# Patient Record
Sex: Male | Born: 1960 | Race: White | Hispanic: No | Marital: Single | Smoking: Current every day smoker
Health system: Southern US, Community
[De-identification: ages and names within clinical notes are randomized; demographics above are authoritative.]

## PROBLEM LIST (undated history)

## (undated) DIAGNOSIS — E785 Hyperlipidemia, unspecified: Secondary | ICD-10-CM

## (undated) DIAGNOSIS — E119 Type 2 diabetes mellitus without complications: Secondary | ICD-10-CM

## (undated) DIAGNOSIS — K759 Inflammatory liver disease, unspecified: Secondary | ICD-10-CM

## (undated) DIAGNOSIS — J449 Chronic obstructive pulmonary disease, unspecified: Secondary | ICD-10-CM

## (undated) DIAGNOSIS — F209 Schizophrenia, unspecified: Secondary | ICD-10-CM

## (undated) DIAGNOSIS — K219 Gastro-esophageal reflux disease without esophagitis: Secondary | ICD-10-CM

## (undated) HISTORY — PX: COLONOSCOPY: SHX174

## (undated) HISTORY — PX: WRIST SURGERY: SHX841

## (undated) HISTORY — PX: TOE SURGERY: SHX1073

## (undated) HISTORY — PX: FOREARM SURGERY: SHX651

---

## 2004-08-19 ENCOUNTER — Emergency Department: Payer: Self-pay | Admitting: Unknown Physician Specialty

## 2008-07-18 ENCOUNTER — Ambulatory Visit: Payer: Self-pay | Admitting: Family Medicine

## 2009-03-27 ENCOUNTER — Ambulatory Visit: Payer: Self-pay | Admitting: Gastroenterology

## 2011-01-27 ENCOUNTER — Ambulatory Visit: Payer: Self-pay

## 2012-10-15 IMAGING — CT CT HEAD WITHOUT CONTRAST
1 series · 16 of 30 positions shown, 20 images · non-contrast
Comparison: none

REASON FOR EXAM: headaches and dizziness
COMMENTS:

PROCEDURE:     CT  - CT HEAD WITHOUT CONTRAST  - January 27, 2011 [DATE]
RESULT:     Comparison:  None
TECHNIQUE: Multiple axial images from the foramen magnum to the vertex were
obtained without IV contrast.

[Series 2: soft tissue · axial · 0.42mm/px · z∈[+382,+522]mm · 16 of 32 slices shown, 20 images]
[im 2/32  brain]
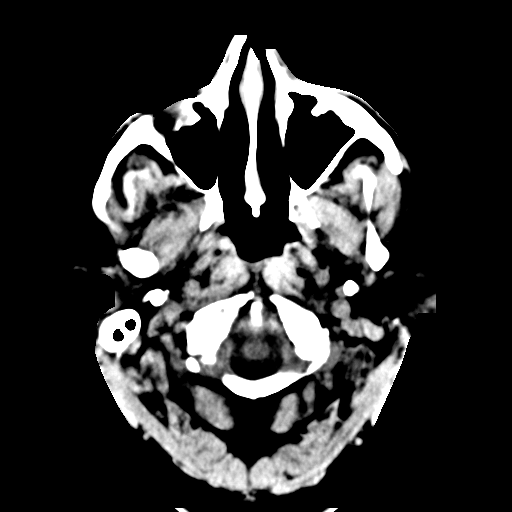
[im 2/32  bone]
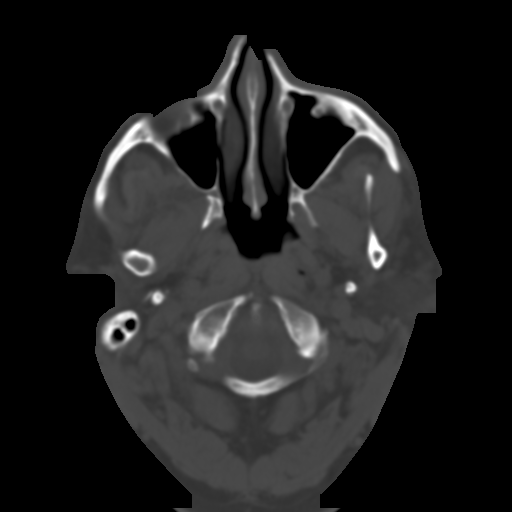
[im 4/32  brain]
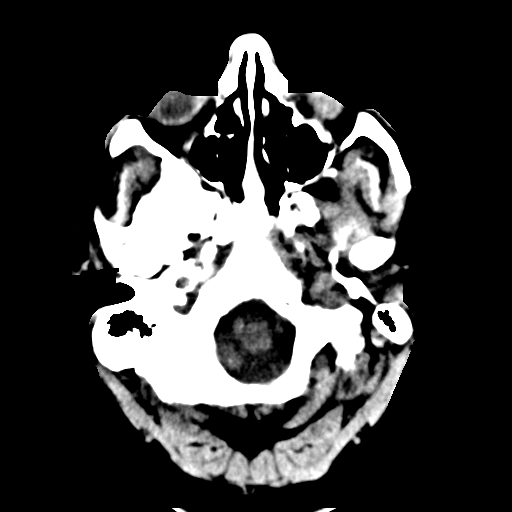
[im 6/32  brain]
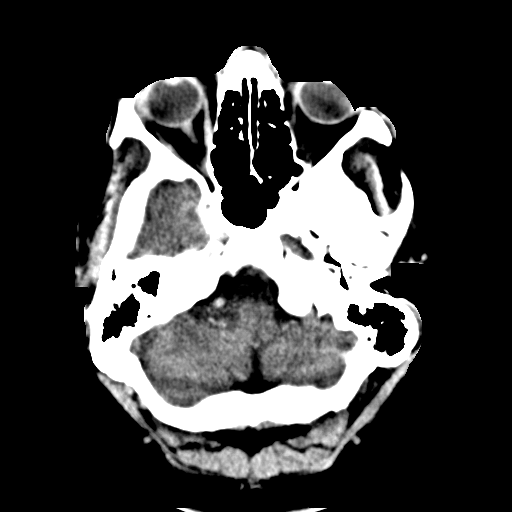
[im 8/32  brain]
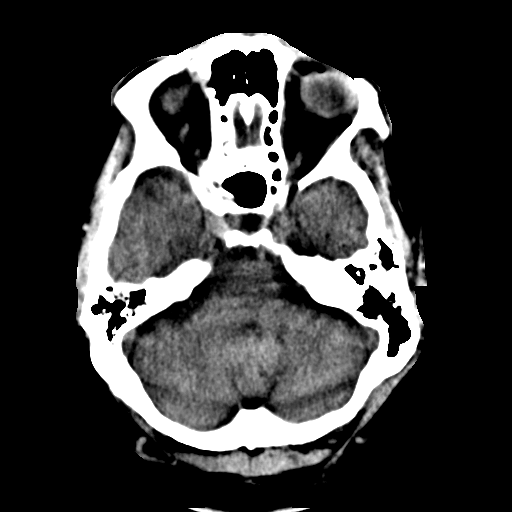
[im 9/32  brain]
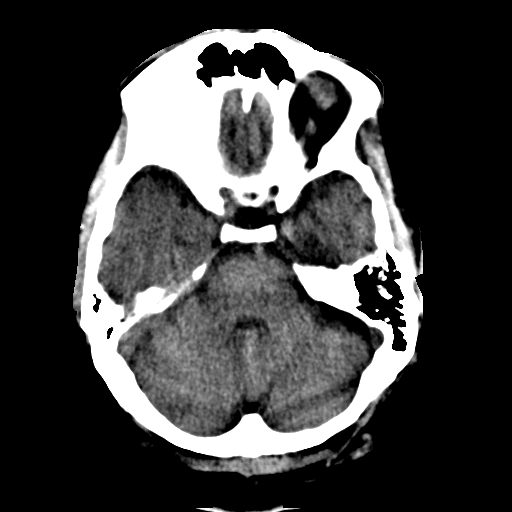
[im 9/32  bone]
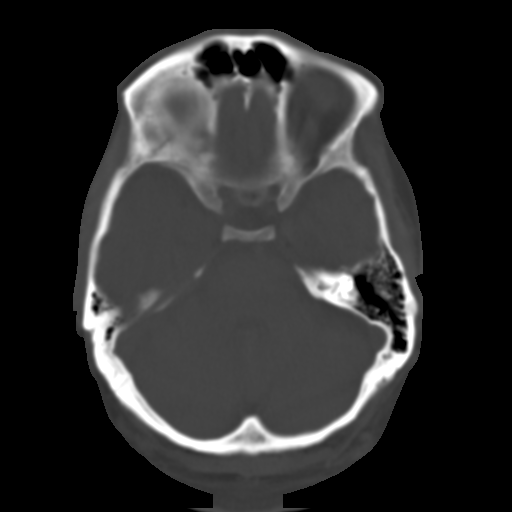
[im 11/32  brain]
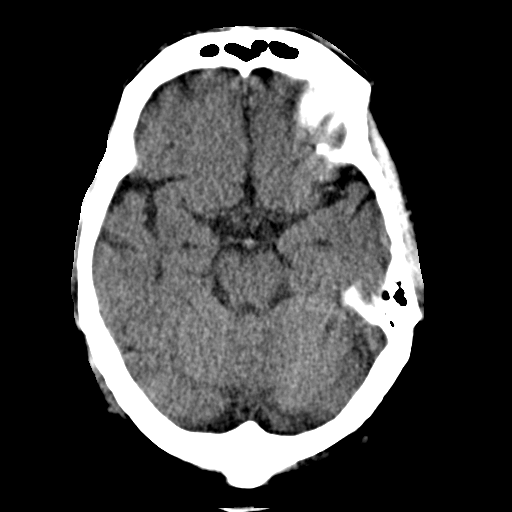
[im 13/32  brain]
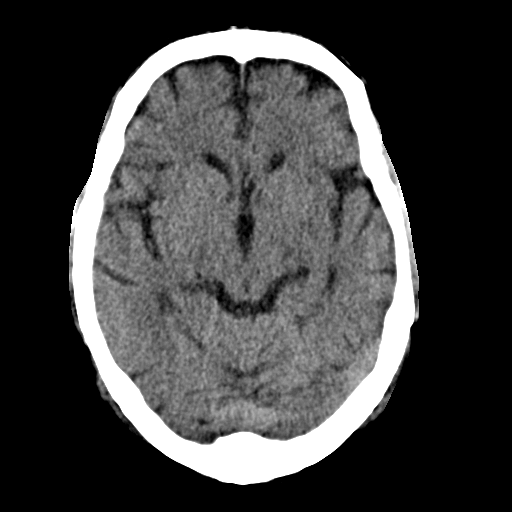
[im 15/32  brain]
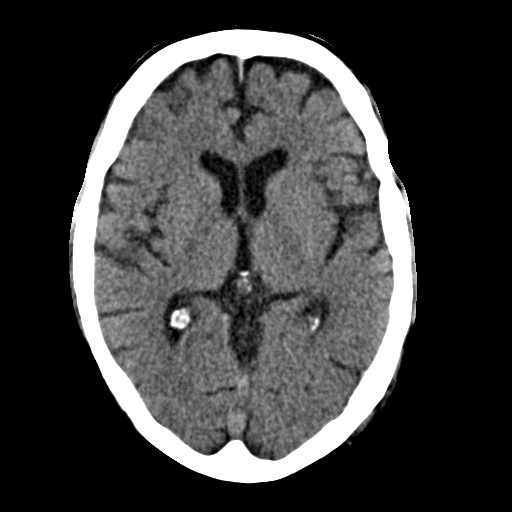
[im 17/32  brain]
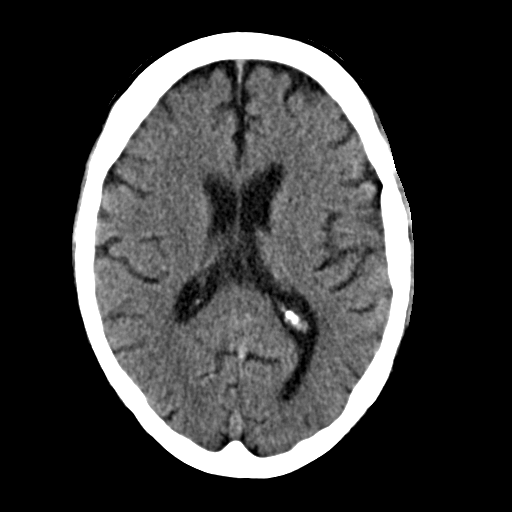
[im 17/32  bone]
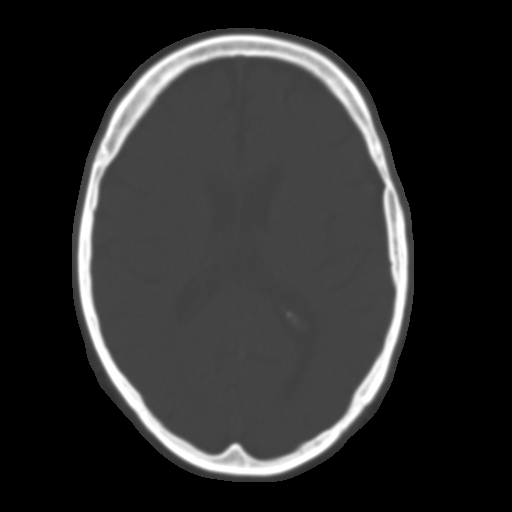
[im 19/32  brain]
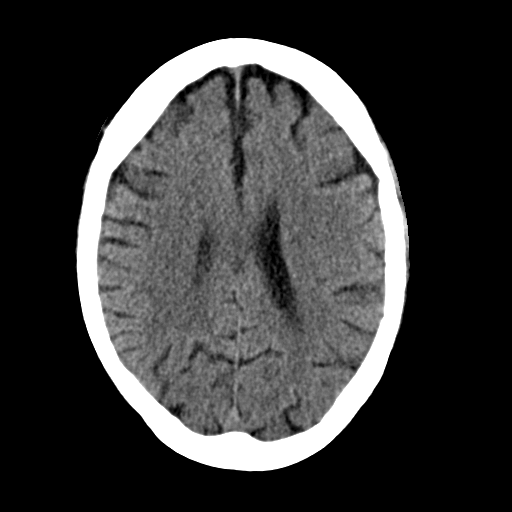
[im 21/32  brain]
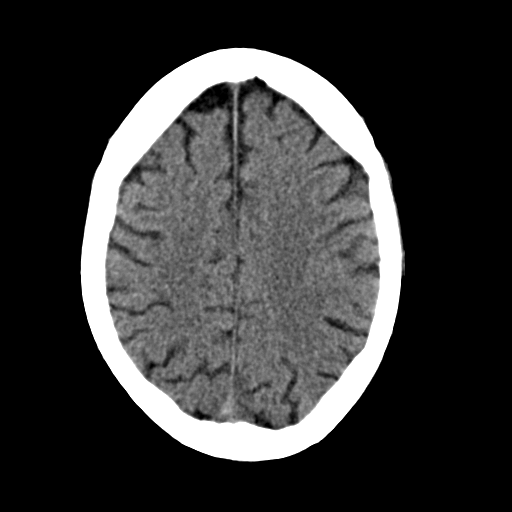
[im 23/32  brain]
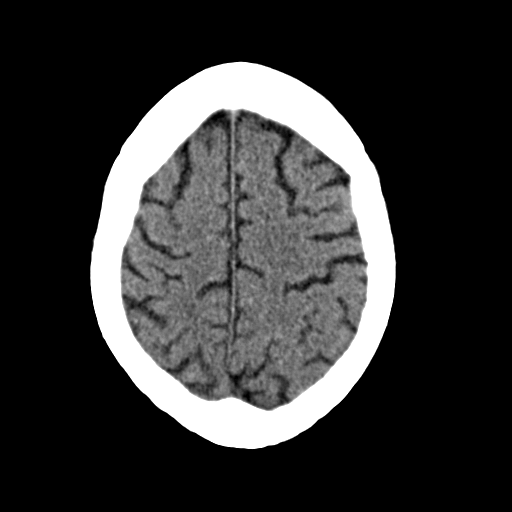
[im 24/32  brain]
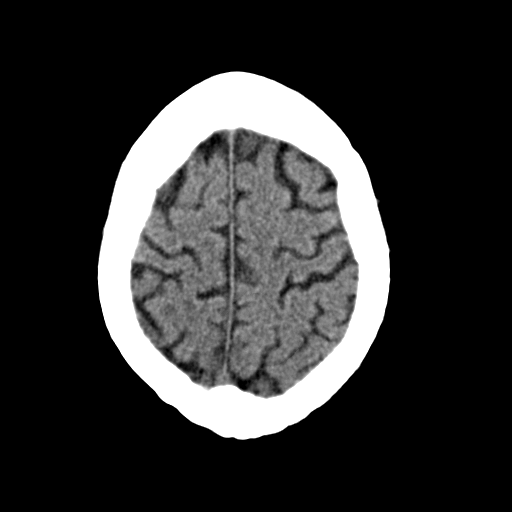
[im 24/32  bone]
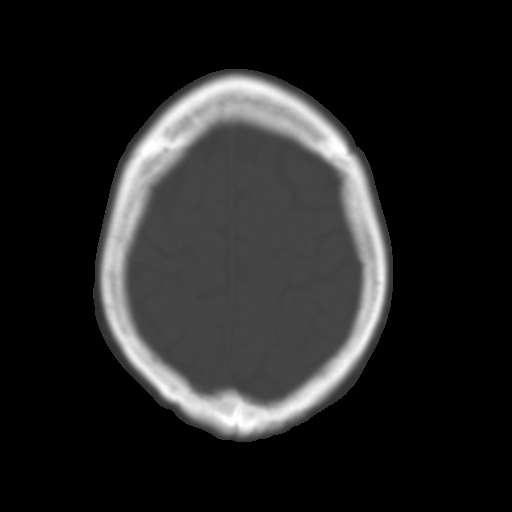
[im 26/32  brain]
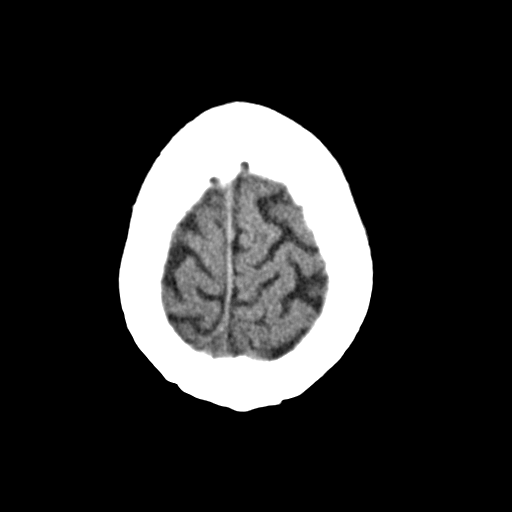
[im 28/32  brain]
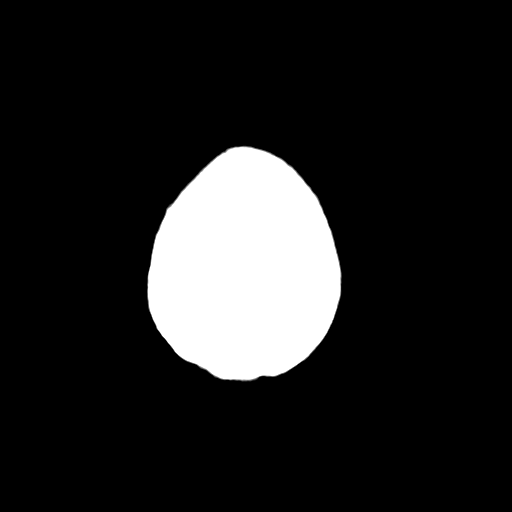
[im 30/32  brain]
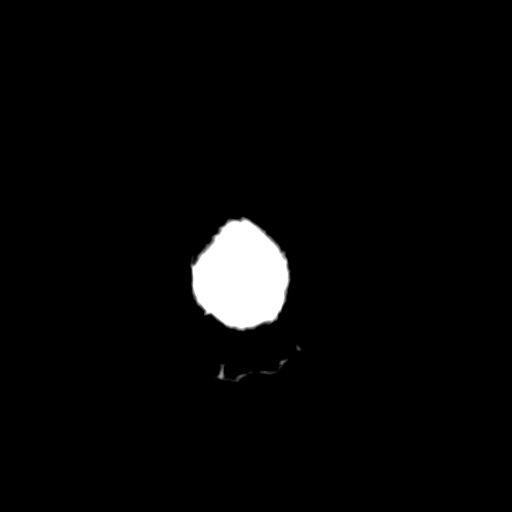

[16 of 30 positions shown; findings below may reference images not displayed]

FINDINGS: There is no evidence for mass effect, midline shift, or extra-axial fluid
collections. There is no evidence for space-occupying lesion, intracranial
hemorrhage, or cortical-based area of infarction.

The osseous structures are unremarkable.
IMPRESSION: No acute intracranial process.

If there is continued clinical concern, consider MRI for further evaluation.

## 2015-07-31 ENCOUNTER — Other Ambulatory Visit: Payer: Self-pay | Admitting: Gastroenterology

## 2015-07-31 DIAGNOSIS — K219 Gastro-esophageal reflux disease without esophagitis: Secondary | ICD-10-CM | POA: Insufficient documentation

## 2015-07-31 DIAGNOSIS — Z8659 Personal history of other mental and behavioral disorders: Secondary | ICD-10-CM | POA: Insufficient documentation

## 2015-07-31 DIAGNOSIS — B182 Chronic viral hepatitis C: Secondary | ICD-10-CM

## 2015-07-31 DIAGNOSIS — Z8679 Personal history of other diseases of the circulatory system: Secondary | ICD-10-CM | POA: Insufficient documentation

## 2015-07-31 DIAGNOSIS — E049 Nontoxic goiter, unspecified: Secondary | ICD-10-CM | POA: Insufficient documentation

## 2015-07-31 DIAGNOSIS — J449 Chronic obstructive pulmonary disease, unspecified: Secondary | ICD-10-CM | POA: Insufficient documentation

## 2015-08-05 ENCOUNTER — Ambulatory Visit
Admission: RE | Admit: 2015-08-05 | Discharge: 2015-08-05 | Disposition: A | Discharge status: Home / Self Care | Payer: Medicare Other | Source: Ambulatory Visit | Attending: Gastroenterology | Admitting: Gastroenterology

## 2015-08-05 DIAGNOSIS — B182 Chronic viral hepatitis C: Secondary | ICD-10-CM

## 2015-08-12 ENCOUNTER — Ambulatory Visit
Admission: RE | Admit: 2015-08-12 | Discharge: 2015-08-12 | Disposition: A | Discharge status: Home / Self Care | Payer: Medicare Other | Source: Ambulatory Visit | Attending: Gastroenterology | Admitting: Gastroenterology

## 2015-08-12 DIAGNOSIS — B182 Chronic viral hepatitis C: Secondary | ICD-10-CM | POA: Insufficient documentation

## 2015-09-24 ENCOUNTER — Encounter: Payer: Self-pay | Admitting: *Deleted

## 2015-09-25 ENCOUNTER — Ambulatory Visit: Payer: Medicare Other | Admitting: Certified Registered Nurse Anesthetist

## 2015-09-25 ENCOUNTER — Encounter: Payer: Self-pay | Admitting: *Deleted

## 2015-09-25 ENCOUNTER — Encounter
Admission: RE | Disposition: A | Discharge status: Home / Self Care | Payer: Self-pay | Source: Ambulatory Visit | Attending: Gastroenterology

## 2015-09-25 ENCOUNTER — Ambulatory Visit
Admission: RE | Admit: 2015-09-25 | Discharge: 2015-09-25 | Disposition: A | Discharge status: Home / Self Care | Payer: Medicare Other | Source: Ambulatory Visit | Attending: Gastroenterology | Admitting: Gastroenterology

## 2015-09-25 DIAGNOSIS — Z79899 Other long term (current) drug therapy: Secondary | ICD-10-CM | POA: Diagnosis not present

## 2015-09-25 DIAGNOSIS — Z1211 Encounter for screening for malignant neoplasm of colon: Secondary | ICD-10-CM | POA: Diagnosis present

## 2015-09-25 DIAGNOSIS — F209 Schizophrenia, unspecified: Secondary | ICD-10-CM | POA: Diagnosis not present

## 2015-09-25 DIAGNOSIS — J449 Chronic obstructive pulmonary disease, unspecified: Secondary | ICD-10-CM | POA: Diagnosis not present

## 2015-09-25 DIAGNOSIS — Z538 Procedure and treatment not carried out for other reasons: Secondary | ICD-10-CM | POA: Diagnosis not present

## 2015-09-25 DIAGNOSIS — E785 Hyperlipidemia, unspecified: Secondary | ICD-10-CM | POA: Diagnosis not present

## 2015-09-25 DIAGNOSIS — K219 Gastro-esophageal reflux disease without esophagitis: Secondary | ICD-10-CM | POA: Insufficient documentation

## 2015-09-25 HISTORY — DX: Inflammatory liver disease, unspecified: K75.9

## 2015-09-25 HISTORY — PX: COLONOSCOPY WITH PROPOFOL: SHX5780

## 2015-09-25 HISTORY — DX: Gastro-esophageal reflux disease without esophagitis: K21.9

## 2015-09-25 HISTORY — DX: Schizophrenia, unspecified: F20.9

## 2015-09-25 HISTORY — DX: Chronic obstructive pulmonary disease, unspecified: J44.9

## 2015-09-25 HISTORY — DX: Hyperlipidemia, unspecified: E78.5

## 2015-09-25 LAB — CBC
HCT: 40.3 % (ref 40.0–52.0)
Hemoglobin: 13.7 g/dL (ref 13.0–18.0)
MCH: 29.6 pg (ref 26.0–34.0)
MCHC: 33.9 g/dL (ref 32.0–36.0)
MCV: 87.1 fL (ref 80.0–100.0)
Platelets: 299 10*3/uL (ref 150–440)
RBC: 4.62 MIL/uL (ref 4.40–5.90)
RDW: 14.3 % (ref 11.5–14.5)
WBC: 4 10*3/uL (ref 3.8–10.6)

## 2015-09-25 LAB — PROTIME-INR
INR: 1.08
PROTHROMBIN TIME: 14.2 s (ref 11.4–15.0)

## 2015-09-25 SURGERY — COLONOSCOPY WITH PROPOFOL
Anesthesia: General

## 2015-09-25 MED ORDER — SODIUM CHLORIDE 0.9 % IV SOLN
INTRAVENOUS | Status: DC
  Administered 2015-09-25: 1000 mL via INTRAVENOUS

## 2015-09-25 MED ORDER — SODIUM CHLORIDE 0.9 % IV SOLN: INTRAVENOUS | Status: DC

## 2015-09-25 MED ORDER — PROPOFOL 500 MG/50ML IV EMUL
INTRAVENOUS | Status: DC | PRN
  Administered 2015-09-25: 140 ug/kg/min via INTRAVENOUS

## 2015-09-25 MED ORDER — PROPOFOL 10 MG/ML IV BOLUS
INTRAVENOUS | Status: DC | PRN
  Administered 2015-09-25: 30 mg via INTRAVENOUS

## 2015-09-25 NOTE — Anesthesia Preprocedure Evaluation (Signed)
Anesthesia Evaluation  Patient identified by MRN, date of birth, ID band Patient awake    Reviewed: Allergy & Precautions, H&P , NPO status , Patient's Chart, lab work & pertinent test results  History of Anesthesia Complications Negative for: history of anesthetic complications  Airway Mallampati: III  TM Distance: >3 FB Neck ROM: limited    Dental  (+) Poor Dentition, Missing   Pulmonary neg shortness of breath, COPD, Current Smoker,    Pulmonary exam normal breath sounds clear to auscultation       Cardiovascular Exercise Tolerance: Good (-) angina(-) Past MI and (-) DOE negative cardio ROS Normal cardiovascular exam Rhythm:regular Rate:Normal     Neuro/Psych PSYCHIATRIC DISORDERS negative neurological ROS     GI/Hepatic Neg liver ROS, GERD  Controlled,(+) Hepatitis -  Endo/Other  negative endocrine ROS  Renal/GU negative Renal ROS  negative genitourinary   Musculoskeletal   Abdominal   Peds  Hematology negative hematology ROS (+)   Anesthesia Other Findings Past Medical History:   COPD (chronic obstructive pulmonary disease) (*              GERD (gastroesophageal reflux disease)                       Schizophrenia (HCC)                                          Hyperlipidemia                                               Hepatitis                                                   Past Surgical History:   TOE SURGERY                                                   FOREARM SURGERY                                               COLONOSCOPY                                                  BMI    Body Mass Index   24.70 kg/m 2      Reproductive/Obstetrics negative OB ROS                             Anesthesia Physical Anesthesia Plan  ASA: III  Anesthesia Plan: General   Post-op Pain Management:    Induction:   Airway Management Planned:   Additional Equipment:    Intra-op Plan:  Post-operative Plan:   Informed Consent: I have reviewed the patients History and Physical, chart, labs and discussed the procedure including the risks, benefits and alternatives for the proposed anesthesia with the patient or authorized representative who has indicated his/her understanding and acceptance.   Dental Advisory Given  Plan Discussed with: Anesthesiologist, CRNA and Surgeon  Anesthesia Plan Comments:         Anesthesia Quick Evaluation

## 2015-09-25 NOTE — Op Note (Signed)
[facility] Gastroenterology Patient Name: Paul Summers Procedure Date: 09/25/2015 2:32 PM MRN: [id number] Account #: [id number] Date of Birth: [birth year] Admit Type: Outpatient Age: 55 Room: [facility] ENDO ROOM 4 Gender: Male Note Status: Finalized Procedure:            Colonoscopy Indications:          Screening for colorectal malignant neoplasm Providers:            [name], MD Referring MD:         Jordan Likes. Lavena Bullion (Referring MD) Medicines:            Monitored Anesthesia Care Complications:        No immediate complications. Procedure:            Pre-Anesthesia Assessment:                       - ASA Grade Assessment: III - A patient with severe                        systemic disease.                       After obtaining informed consent, the colonoscope was                        passed under direct vision. Throughout the procedure,                        the patient's blood pressure, pulse, and oxygen                        saturations were monitored continuously. The                        Colonoscope was introduced through the anus with the                        intention of advancing to the cecum. The scope was                        advanced to the sigmoid colon before the procedure was                        aborted. Medications were given. The colonoscopy was                        extremely difficult due to poor bowel prep with stool                        present. The patient tolerated the procedure well. Findings:      A moderate amount of semi-liquid stool was found in the sigmoid colon,       precluding visualization.      The digital rectal exam was normal. Impression:           - Stool in the sigmoid colon.                       - No specimens collected. Recommendation:       - Discharge patient to home.                       -  Patient to reprep and reschedule. Procedure Code(s):    --- Professional ---                       [id number],  82, Colonoscopy, flexible; diagnostic, including                        collection of specimen(s) by brushing or washing, when                        performed (separate procedure) Diagnosis Code(s):    --- Professional ---                       Z12.11, Encounter for screening for malignant neoplasm                        of colon CPT copyright 2016 [employer]. All rights reserved. The codes documented in this report are preliminary and upon coder review may  be revised to meet current compliance requirements. [name], MD 09/25/2015 3:25:35 PM This report has been signed electronically. Number of Addenda: 0 Note Initiated On: 09/25/2015 2:32 PM Total Procedure Duration: 0 hours 4 minutes 28 seconds       [facility]

## 2015-09-25 NOTE — H&P (Signed)
Outpatient short stay form Pre-procedure 09/25/2015 3:06 PM [name] MD  Primary Physician: Dr. Lavena Bullion  Reason for visit:  Colonoscopy  History of present illness:  Patient is a 55 year old male presenting today for colonoscopy. His last colonoscopy was 2010 with the finding of a adenoma. He tolerated his prep well. He denies use of any aspirin products or blood thinners.    Current facility-administered medications:  .  0.9 %  sodium chloride infusion, , Intravenous, Continuous, [name], MD, Last Rate: 20 mL/hr at 09/25/15 1424, 1,000 mL at 09/25/15 1424 .  0.9 %  sodium chloride infusion, , Intravenous, Continuous, [name], MD  Prescriptions prior to admission  Medication Sig Dispense Refill Last Dose  . benztropine (COGENTIN) 1 MG tablet Take 1 mg by mouth 2 (two) times daily.     . cholecalciferol (VITAMIN D) 1000 units tablet Take 1,000 Units by mouth daily.   09/25/2015 at 0700  . divalproex (DEPAKOTE ER) 500 MG 24 hr tablet Take 1,000 mg by mouth daily.     . fenofibrate 160 MG tablet Take 160 mg by mouth daily.     . Fish Oil-Cholecalciferol (OMEGA-3 FISH OIL/VITAMIN D3) 1000-1000 MG-UNIT CAPS Take by mouth.     . Fluticasone-Salmeterol (ADVAIR) 250-50 MCG/DOSE AEPB Inhale 1 puff into the lungs 2 (two) times daily.     . haloperidol lactate (HALDOL) 5 MG/ML injection 5 mg.     Marland Kitchen LORazepam (ATIVAN) 1 MG tablet Take 1 mg by mouth every 8 (eight) hours.     Marland Kitchen OLANZapine (ZYPREXA) 20 MG tablet Take 20 mg by mouth at bedtime.     Marland Kitchen omeprazole (PRILOSEC) 20 MG capsule Take 20 mg by mouth daily.   09/25/2015 at 0700  . Skin Protectants, Misc. (EUCERIN) cream Apply topically as needed for dry skin.     . [DISCONTINUED] albuterol (PROVENTIL HFA;VENTOLIN HFA) 108 (90 Base) MCG/ACT inhaler Inhale 2 puffs into the lungs every 6 (six) hours as needed for wheezing or shortness of breath.     . [DISCONTINUED] QUEtiapine (SEROQUEL) 100 MG tablet Take 100 mg by mouth at  bedtime.     . [DISCONTINUED] tiotropium (SPIRIVA) 18 MCG inhalation capsule Place 18 mcg into inhaler and inhale daily.     . [DISCONTINUED] varenicline (CHANTIX) 1 MG tablet Take 1 mg by mouth 2 (two) times daily.        Allergies  Allergen Reactions  . Ether   . Sulfa Antibiotics      Past Medical History  Diagnosis Date  . COPD (chronic obstructive pulmonary disease) ([place])   . GERD (gastroesophageal reflux disease)   . Schizophrenia ([place])   . Hyperlipidemia   . Hepatitis     Review of systems:      Physical Exam    Heart and lungs: Regular rate and rhythm without rub or gallop, lungs are bilaterally clear.    HEENT: Normocephalic atraumatic eyes are anicteric    Other:     Pertinant exam for procedure: Soft nontender nondistended bowel sounds positive normoactive.    Planned proceedures: Colonoscopy and indicated procedures. I have discussed the risks benefits and complications of procedures to include not limited to bleeding, infection, perforation and the risk of sedation and the patient wishes to proceed.    [name], MD Gastroenterology 09/25/2015  3:06 PM

## 2015-09-25 NOTE — Transfer of Care (Signed)
Immediate Anesthesia Transfer of Care Note  Patient: Paul Summers  Procedure(s) Performed: Procedure(s): COLONOSCOPY WITH PROPOFOL (N/A)  Patient Location: PACU  Anesthesia Type:General  Level of Consciousness: sedated  Airway & Oxygen Therapy: Patient Spontanous Breathing and Patient connected to nasal cannula oxygen  Post-op Assessment: Report given to RN  Post vital signs: Reviewed and stable  Last Vitals:  Filed Vitals:   09/25/15 1306 09/25/15 1524  BP: 104/66 100/68  Pulse: 74 71  Temp: 36.4 C 36 C  Resp: 16 18    Last Pain: There were no vitals filed for this visit.       Complications: No apparent anesthesia complications

## 2015-09-25 NOTE — Anesthesia Procedure Notes (Signed)
Date/Time: 09/25/2015 3:10 PM Performed by: Johnna Acosta Pre-anesthesia Checklist: Patient identified, Emergency Drugs available, Suction available, Patient being monitored and Timeout performed Patient Re-evaluated:Patient Re-evaluated prior to inductionOxygen Delivery Method: Nasal cannula

## 2015-09-27 ENCOUNTER — Encounter: Payer: Self-pay | Admitting: Gastroenterology

## 2015-09-28 NOTE — Anesthesia Postprocedure Evaluation (Signed)
Anesthesia Post Note  Patient: Paul Summers  Procedure(s) Performed: Procedure(s) (LRB): COLONOSCOPY WITH PROPOFOL (N/A)  Patient location during evaluation: Endoscopy Anesthesia Type: General Level of consciousness: awake and alert Pain management: pain level controlled Vital Signs Assessment: post-procedure vital signs reviewed and stable Respiratory status: spontaneous breathing, nonlabored ventilation, respiratory function stable and patient connected to nasal cannula oxygen Cardiovascular status: blood pressure returned to baseline and stable Postop Assessment: no signs of nausea or vomiting Anesthetic complications: no    Last Vitals:  Filed Vitals:   09/25/15 1550 09/25/15 1600  BP: 115/81 123/82  Pulse: 57 68  Temp:    Resp: 15 12    Last Pain: There were no vitals filed for this visit.               Martha Clan

## 2015-09-29 ENCOUNTER — Ambulatory Visit: Payer: Medicare Other | Admitting: Anesthesiology

## 2015-09-29 ENCOUNTER — Encounter
Admission: RE | Disposition: A | Discharge status: Nursing Home (Includes ICF) | Payer: Self-pay | Source: Ambulatory Visit | Attending: Gastroenterology

## 2015-09-29 ENCOUNTER — Encounter: Payer: Self-pay | Admitting: *Deleted

## 2015-09-29 ENCOUNTER — Ambulatory Visit
Admission: RE | Admit: 2015-09-29 | Discharge: 2015-09-29 | Disposition: A | Discharge status: Nursing Home (Includes ICF) | Payer: Medicare Other | Source: Ambulatory Visit | Attending: Gastroenterology | Admitting: Gastroenterology

## 2015-09-29 DIAGNOSIS — D127 Benign neoplasm of rectosigmoid junction: Secondary | ICD-10-CM | POA: Insufficient documentation

## 2015-09-29 DIAGNOSIS — J449 Chronic obstructive pulmonary disease, unspecified: Secondary | ICD-10-CM | POA: Diagnosis not present

## 2015-09-29 DIAGNOSIS — Z888 Allergy status to other drugs, medicaments and biological substances status: Secondary | ICD-10-CM | POA: Insufficient documentation

## 2015-09-29 DIAGNOSIS — Z1211 Encounter for screening for malignant neoplasm of colon: Secondary | ICD-10-CM | POA: Insufficient documentation

## 2015-09-29 DIAGNOSIS — D124 Benign neoplasm of descending colon: Secondary | ICD-10-CM | POA: Diagnosis not present

## 2015-09-29 DIAGNOSIS — E785 Hyperlipidemia, unspecified: Secondary | ICD-10-CM | POA: Diagnosis not present

## 2015-09-29 DIAGNOSIS — F17200 Nicotine dependence, unspecified, uncomplicated: Secondary | ICD-10-CM | POA: Insufficient documentation

## 2015-09-29 DIAGNOSIS — Z882 Allergy status to sulfonamides status: Secondary | ICD-10-CM | POA: Diagnosis not present

## 2015-09-29 DIAGNOSIS — Z79899 Other long term (current) drug therapy: Secondary | ICD-10-CM | POA: Insufficient documentation

## 2015-09-29 DIAGNOSIS — K5730 Diverticulosis of large intestine without perforation or abscess without bleeding: Secondary | ICD-10-CM | POA: Diagnosis not present

## 2015-09-29 DIAGNOSIS — B1920 Unspecified viral hepatitis C without hepatic coma: Secondary | ICD-10-CM | POA: Insufficient documentation

## 2015-09-29 DIAGNOSIS — K219 Gastro-esophageal reflux disease without esophagitis: Secondary | ICD-10-CM | POA: Diagnosis not present

## 2015-09-29 DIAGNOSIS — F209 Schizophrenia, unspecified: Secondary | ICD-10-CM | POA: Insufficient documentation

## 2015-09-29 HISTORY — PX: COLONOSCOPY WITH PROPOFOL: SHX5780

## 2015-09-29 SURGERY — COLONOSCOPY WITH PROPOFOL
Anesthesia: General

## 2015-09-29 MED ORDER — LIDOCAINE HCL (CARDIAC) 20 MG/ML IV SOLN
INTRAVENOUS | Status: DC | PRN
  Administered 2015-09-29: 30 mg via INTRAVENOUS

## 2015-09-29 MED ORDER — PROPOFOL 500 MG/50ML IV EMUL
INTRAVENOUS | Status: DC | PRN
  Administered 2015-09-29: 150 ug/kg/min via INTRAVENOUS

## 2015-09-29 MED ORDER — PROPOFOL 10 MG/ML IV BOLUS
INTRAVENOUS | Status: DC | PRN
  Administered 2015-09-29: 50 mg via INTRAVENOUS

## 2015-09-29 MED ORDER — PHENYLEPHRINE HCL 10 MG/ML IJ SOLN
INTRAMUSCULAR | Status: DC | PRN
  Administered 2015-09-29: 100 ug via INTRAVENOUS

## 2015-09-29 MED ORDER — SODIUM CHLORIDE 0.9 % IV SOLN
INTRAVENOUS | Status: DC
  Administered 2015-09-29: 1000 mL via INTRAVENOUS

## 2015-09-29 MED ORDER — FENTANYL CITRATE (PF) 100 MCG/2ML IJ SOLN
INTRAMUSCULAR | Status: DC | PRN
  Administered 2015-09-29: 50 ug via INTRAVENOUS

## 2015-09-29 MED ORDER — SODIUM CHLORIDE 0.9 % IV SOLN: INTRAVENOUS | Status: DC

## 2015-09-29 MED ORDER — MIDAZOLAM HCL 5 MG/5ML IJ SOLN
INTRAMUSCULAR | Status: DC | PRN
  Administered 2015-09-29: 1 mg via INTRAVENOUS

## 2015-09-29 NOTE — Op Note (Signed)
[facility] Gastroenterology Patient Name: Safal Kaster Procedure Date: 09/29/2015 3:14 PM MRN: [id number] Account #: [id number] Date of Birth: 1960/10/05 Admit Type: Outpatient Age: 55 Room: [facility] ENDO ROOM 3 Gender: Male Note Status: Finalized Procedure:            Colonoscopy Indications:          Screening for colorectal malignant neoplasm Providers:            [name], MD Referring MD:         No Local Md, MD (Referring MD) Medicines:            Monitored Anesthesia Care Complications:        No immediate complications. Procedure:            Pre-Anesthesia Assessment:                       - ASA Grade Assessment: III - A patient with severe                        systemic disease.                       After obtaining informed consent, the colonoscope was                        passed under direct vision. Throughout the procedure,                        the patient's blood pressure, pulse, and oxygen                        saturations were monitored continuously. The Olympus                        PCF-H180AL colonoscope ( S#: [id number] ) was introduced                        through the anus and advanced to the the cecum,                        identified by appendiceal orifice and ileocecal valve.                        The colonoscopy was performed without difficulty. The                        patient tolerated the procedure well. The quality of                        the bowel preparation was good. Findings:      Four sessile polyps were found in the recto-sigmoid colon. The polyps       were 1 to 3 mm in size. These polyps were removed with a cold biopsy       forceps. Resection and retrieval were complete.      A 1 mm polyp was found in the descending colon. The polyp was sessile.       The polyp was removed with a cold biopsy forceps. Resection and       retrieval were complete.      Multiple small-mouthed diverticula were found  in the sigmoid  colon and       descending colon. Impression:           - Four 1 to 3 mm polyps at the recto-sigmoid colon,                        removed with a cold biopsy forceps. Resected and                        retrieved.                       - One 1 mm polyp in the descending colon, removed with                        a cold biopsy forceps. Resected and retrieved.                       - Diverticulosis in the sigmoid colon and in the                        descending colon. Recommendation:       - Await pathology results.                       - Telephone GI clinic for pathology results in 1 week. Procedure Code(s):    --- Professional ---                       [id number], Colonoscopy, flexible; with biopsy, single or                        multiple Diagnosis Code(s):    --- Professional ---                       Z12.11, Encounter for screening for malignant neoplasm                        of colon                       D12.7, Benign neoplasm of rectosigmoid junction                       D12.4, Benign neoplasm of descending colon                       K57.30, Diverticulosis of large intestine without                        perforation or abscess without bleeding CPT copyright 2016 American Medical Association. All rights reserved. The codes documented in this report are preliminary and upon coder review may  be revised to meet current compliance requirements. [name], MD 09/29/2015 3:57:42 PM This report has been signed electronically. Number of Addenda: 0 Note Initiated On: 09/29/2015 3:14 PM Scope Withdrawal Time: 0 hours 12 minutes 39 seconds  Total Procedure Duration: 0 hours 29 minutes 38 seconds       [facility]

## 2015-09-29 NOTE — Anesthesia Preprocedure Evaluation (Signed)
Anesthesia Evaluation  Patient identified by MRN, date of birth, ID band Patient awake    Reviewed: Allergy & Precautions, NPO status , Patient's Chart, lab work & pertinent test results  History of Anesthesia Complications Negative for: history of anesthetic complications  Airway Mallampati: II       Dental  (+) Edentulous Upper, Edentulous Lower   Pulmonary neg pulmonary ROS, COPD, Current Smoker,           Cardiovascular negative cardio ROS       Neuro/Psych Schizophrenia negative neurological ROS     GI/Hepatic PUD, GERD  Medicated,(+) Hepatitis -, C  Endo/Other  negative endocrine ROS  Renal/GU negative Renal ROS     Musculoskeletal   Abdominal   Peds  Hematology   Anesthesia Other Findings   Reproductive/Obstetrics                             Anesthesia Physical Anesthesia Plan  ASA: III  Anesthesia Plan: General   Post-op Pain Management:    Induction: Intravenous  Airway Management Planned: Nasal Cannula  Additional Equipment:   Intra-op Plan:   Post-operative Plan:   Informed Consent: I have reviewed the patients History and Physical, chart, labs and discussed the procedure including the risks, benefits and alternatives for the proposed anesthesia with the patient or authorized representative who has indicated his/her understanding and acceptance.     Plan Discussed with:   Anesthesia Plan Comments:         Anesthesia Quick Evaluation

## 2015-09-29 NOTE — Anesthesia Postprocedure Evaluation (Signed)
Anesthesia Post Note  Patient: Paul Summers  Procedure(s) Performed: Procedure(s) (LRB): COLONOSCOPY WITH PROPOFOL (N/A)  Patient location during evaluation: Endoscopy Anesthesia Type: General Level of consciousness: awake and alert Pain management: pain level controlled Vital Signs Assessment: post-procedure vital signs reviewed and stable Respiratory status: spontaneous breathing, nonlabored ventilation, respiratory function stable and patient connected to nasal cannula oxygen Cardiovascular status: blood pressure returned to baseline and stable Postop Assessment: no signs of nausea or vomiting Anesthetic complications: no    Last Vitals:  Filed Vitals:   09/29/15 1620 09/29/15 1630  BP: 110/85 121/78  Pulse: 68 64  Temp:    Resp: 15 16    Last Pain: There were no vitals filed for this visit.               Sheena Simonis S

## 2015-09-29 NOTE — H&P (Signed)
Outpatient short stay form Pre-procedure 09/29/2015 3:16 PM [name] MD  Primary Physician: Dr. Lavena Bullion  Reason for visit:  Colonoscopy  History of present illness:  Paul Summers is a 55 year old male presenting today for colonoscopy. His last colonoscopy was in 2010 with a finding of an adenoma. He came in last week to have the procedure done however the prep was not good and required rescheduling and reprepped. He denies use of any aspirin products or blood thinners.    Current facility-administered medications:  .  0.9 %  sodium chloride infusion, , Intravenous, Continuous, [name], MD, Last Rate: 20 mL/hr at 09/29/15 1450, 1,000 mL at 09/29/15 1450 .  0.9 %  sodium chloride infusion, , Intravenous, Continuous, [name], MD  Facility-Administered Medications Ordered in Other Encounters:  .  midazolam (VERSED) 5 MG/5ML injection, , Intravenous, Anesthesia Intra-op, Courtney Paris, CRNA, 1 mg at 09/29/15 1516  Prescriptions prior to admission  Medication Sig Dispense Refill Last Dose  . benztropine (COGENTIN) 1 MG tablet Take 1 mg by mouth 2 (two) times daily.     . cholecalciferol (VITAMIN D) 1000 units tablet Take 1,000 Units by mouth daily.   09/25/2015 at 0700  . divalproex (DEPAKOTE ER) 500 MG 24 hr tablet Take 1,000 mg by mouth daily.     . fenofibrate 160 MG tablet Take 160 mg by mouth daily.     . Fish Oil-Cholecalciferol (OMEGA-3 FISH OIL/VITAMIN D3) 1000-1000 MG-UNIT CAPS Take by mouth.     . Fluticasone-Salmeterol (ADVAIR) 250-50 MCG/DOSE AEPB Inhale 1 puff into the lungs 2 (two) times daily.     . haloperidol lactate (HALDOL) 5 MG/ML injection 5 mg.     Marland Kitchen LORazepam (ATIVAN) 1 MG tablet Take 1 mg by mouth every 8 (eight) hours.     Marland Kitchen OLANZapine (ZYPREXA) 20 MG tablet Take 20 mg by mouth at bedtime.     Marland Kitchen omeprazole (PRILOSEC) 20 MG capsule Take 20 mg by mouth daily.   09/25/2015 at 0700  . Skin Protectants, Misc. (EUCERIN) cream Apply topically as needed  for dry skin.        Allergies  Allergen Reactions  . Ether   . Sulfa Antibiotics      Past Medical History  Diagnosis Date  . COPD (chronic obstructive pulmonary disease) ([place])   . GERD (gastroesophageal reflux disease)   . Schizophrenia ([place])   . Hyperlipidemia   . Hepatitis     Review of systems:      Physical Exam    Heart and lungs: Regular rate and rhythm without rub or gallop, lungs are bilaterally clear.    HEENT: Normocephalic atraumatic eyes are anicteric    Other:     Pertinant exam for procedure: Soft nontender nondistended bowel sounds positive normoactive.    Planned proceedures: Colonoscopy and indicated procedures. I have discussed the risks benefits and complications of procedures to include not limited to bleeding, infection, perforation and the risk of sedation and the Paul Summers wishes to proceed.    [name], MD Gastroenterology 09/29/2015  3:16 PM

## 2015-09-29 NOTE — Transfer of Care (Signed)
Immediate Anesthesia Transfer of Care Note  Patient: Paul Summers  Procedure(s) Performed: Procedure(s): COLONOSCOPY WITH PROPOFOL (N/A)  Patient Location: PACU and Endoscopy Unit  Anesthesia Type:General  Level of Consciousness: sedated and patient cooperative  Airway & Oxygen Therapy: Patient Spontanous Breathing and Patient connected to nasal cannula oxygen  Post-op Assessment: Report given to RN and Post -op Vital signs reviewed and stable  Post vital signs: Reviewed and stable  Last Vitals:  Filed Vitals:   09/29/15 1600 09/29/15 1601  BP: 92/62 92/62  Pulse: 68 67  Temp: 36.1 C 36.1 C  Resp: 16     Last Pain: There were no vitals filed for this visit.       Complications: No apparent anesthesia complications

## 2015-10-01 LAB — SURGICAL PATHOLOGY

## 2015-10-03 ENCOUNTER — Encounter: Payer: Self-pay | Admitting: Gastroenterology

## 2017-06-09 ENCOUNTER — Encounter: Payer: Self-pay | Admitting: Emergency Medicine

## 2017-06-09 ENCOUNTER — Other Ambulatory Visit: Payer: Self-pay

## 2017-06-09 ENCOUNTER — Emergency Department
Admission: EM | Admit: 2017-06-09 | Discharge: 2017-06-09 | Disposition: A | Discharge status: Home / Self Care | Payer: Worker's Compensation | Attending: Emergency Medicine | Admitting: Emergency Medicine

## 2017-06-09 DIAGNOSIS — Y929 Unspecified place or not applicable: Secondary | ICD-10-CM | POA: Insufficient documentation

## 2017-06-09 DIAGNOSIS — S60212A Contusion of left wrist, initial encounter: Secondary | ICD-10-CM | POA: Diagnosis not present

## 2017-06-09 DIAGNOSIS — F17200 Nicotine dependence, unspecified, uncomplicated: Secondary | ICD-10-CM | POA: Insufficient documentation

## 2017-06-09 DIAGNOSIS — Z79899 Other long term (current) drug therapy: Secondary | ICD-10-CM | POA: Insufficient documentation

## 2017-06-09 DIAGNOSIS — Y9389 Activity, other specified: Secondary | ICD-10-CM | POA: Insufficient documentation

## 2017-06-09 DIAGNOSIS — Y990 Civilian activity done for income or pay: Secondary | ICD-10-CM | POA: Insufficient documentation

## 2017-06-09 DIAGNOSIS — J449 Chronic obstructive pulmonary disease, unspecified: Secondary | ICD-10-CM | POA: Insufficient documentation

## 2017-06-09 DIAGNOSIS — S6992XA Unspecified injury of left wrist, hand and finger(s), initial encounter: Secondary | ICD-10-CM | POA: Diagnosis present

## 2017-06-09 DIAGNOSIS — W228XXA Striking against or struck by other objects, initial encounter: Secondary | ICD-10-CM | POA: Diagnosis not present

## 2017-06-09 NOTE — ED Provider Notes (Signed)
[facility] Emergency Department Provider Note ____________________________________________  Time seen: Approximately 10:09 AM  I have reviewed the triage vital signs and the nursing notes.   HISTORY  Chief Complaint Arm Injury    HPI Paul Summers is a 57 y.o. male who presents to the emergency department for evaluation and treatment after a box fell onto his left wrist while at work.  Patient states that immediately he began to see bruising and swelling in that area.  He is concerned because he has had some vascular issues in that area in the past.  He has no pain with movement of the wrist.  No alleviating measures have been attempted for this complaint prior to arrival.  Past Medical History:  Diagnosis Date  . COPD (chronic obstructive pulmonary disease) ([place])   . GERD (gastroesophageal reflux disease)   . Hepatitis   . Hyperlipidemia   . Schizophrenia ([place])     There are no active problems to display for this patient.   Past Surgical History:  Procedure Laterality Date  . COLONOSCOPY    . COLONOSCOPY WITH PROPOFOL N/A 09/25/2015   Procedure: COLONOSCOPY WITH PROPOFOL;  Surgeon: [name], MD;  Location: [facility] ENDOSCOPY;  Service: Endoscopy;  Laterality: N/A;  . COLONOSCOPY WITH PROPOFOL N/A 09/29/2015   Procedure: COLONOSCOPY WITH PROPOFOL;  Surgeon: [name], MD;  Location: [facility] ENDOSCOPY;  Service: Endoscopy;  Laterality: N/A;  . FOREARM SURGERY    . TOE SURGERY      Prior to Admission medications   Medication Sig Start Date End Date Taking? Authorizing Provider  benztropine (COGENTIN) 1 MG tablet Take 1 mg by mouth 2 (two) times daily.    [provider]  cholecalciferol (VITAMIN D) 1000 units tablet Take 1,000 Units by mouth daily.    [provider]  divalproex (DEPAKOTE ER) 500 MG 24 hr tablet Take 1,000 mg by mouth daily.    [provider]  fenofibrate 160 MG tablet Take 160 mg by mouth daily.     [provider]  Fish Oil-Cholecalciferol (OMEGA-3 FISH OIL/VITAMIN D3) 1000-1000 MG-UNIT CAPS Take by mouth.    [provider]  Fluticasone-Salmeterol (ADVAIR) 250-50 MCG/DOSE AEPB Inhale 1 puff into the lungs 2 (two) times daily.    [provider]  haloperidol lactate (HALDOL) 5 MG/ML injection 5 mg.    [provider]  LORazepam (ATIVAN) 1 MG tablet Take 1 mg by mouth every 8 (eight) hours.    [provider]  OLANZapine (ZYPREXA) 20 MG tablet Take 20 mg by mouth at bedtime.    [provider]  omeprazole (PRILOSEC) 20 MG capsule Take 20 mg by mouth daily.    [provider]  Skin Protectants, Misc. (EUCERIN) cream Apply topically as needed for dry skin.    [provider]    Allergies Ether and Sulfa antibiotics  No family history on file.  Social History Social History   Tobacco Use  . Smoking status: Current Every Day Smoker  . Smokeless tobacco: Never Used  Substance Use Topics  . Alcohol use: No  . Drug use: No    Review of Systems Constitutional: Negative for recent illness. Cardiovascular: Negative for active bleeding. Respiratory: Negative for shortness of breath. Musculoskeletal: Negative for myalgias. Skin: Positive for hematoma to the left wrist. Neurological: Negative for paresthesias.  ____________________________________________   PHYSICAL EXAM:  VITAL SIGNS: ED Triage Vitals [06/09/17 0959]  Enc Vitals Group     BP 124/89  Pulse Rate 80     Resp 20     Temp 98 F (36.7 C)     Temp Source Oral     SpO2 100 %     Weight 170 lb (77.1 kg)     Height 5\' 10"  (1.778 m)     Head Circumference      Peak Flow      Pain Score      Pain Loc      Pain Edu?      Excl. in [place]?     Constitutional: Alert and oriented. Well appearing and in no acute distress. Eyes: Conjunctivae are clear without discharge or drainage Head: Atraumatic Neck: Supple, active range of motion  observed. Respiratory: Respirations even and unlabored. Musculoskeletal: Full, active range of motion throughout, specifically of the left hand and wrist.  No bony tenderness on palpation.  No noted deformity of the left wrist. Neurologic: Motor and sensation are intact throughout. Skin: Hematoma noted to the volar aspect of the left wrist. Psychiatric: Affect and behavior are appropriate.  ____________________________________________   LABS (all labs ordered are listed, but only abnormal results are displayed)  Labs Reviewed - No data to display ____________________________________________  RADIOLOGY  Not indicated ____________________________________________   PROCEDURES  Procedures  ____________________________________________   INITIAL IMPRESSION / ASSESSMENT AND PLAN / ED COURSE  Paul Summers is a 57 y.o. male who presents to the emergency department for evaluation after he lifted a box and it hit his left wrist.  No bony abnormalities suspected.  Reassurance provided to the patient.  He is to follow-up with his primary care provider for any symptoms of concern or return to the emergency department if he is unable to schedule an appointment.  Medications - No data to display  Pertinent labs & imaging results that were available during my care of the patient were reviewed by me and considered in my medical decision making (see chart for details).  _________________________________________   FINAL CLINICAL IMPRESSION(S) / ED DIAGNOSES  Final diagnoses:  Traumatic hematoma of left wrist, initial encounter    ED Discharge Orders    None       If controlled substance prescribed during this visit, 12 month history viewed on the [place] prior to issuing an initial prescription for Schedule II or III opiod.    Victorino Dike, FNP 06/09/17 1042    Harvest Dark, MD 06/09/17 1436

## 2017-06-09 NOTE — ED Notes (Signed)
Supervisor from [employer]	[place] here with pt  States he needs to file w/c .Paul Summers

## 2017-06-09 NOTE — ED Triage Notes (Signed)
Presents via ems from work  States he lifted a box and it hit his left wrist  Has small hematoma to area

## 2017-06-09 NOTE — Discharge Instructions (Signed)
Please follow up with your primary care provider for concerns.

## 2018-03-03 IMAGING — US US ABDOMEN COMPLETE
1 series · 13 of 13 positions shown · non-contrast
Comparison: None.

CLINICAL DATA: Hepatitis-C.



[Series 1: us abdomen complete · 0.19mm/px · 13 of 13 slices shown]
[im 1/13]
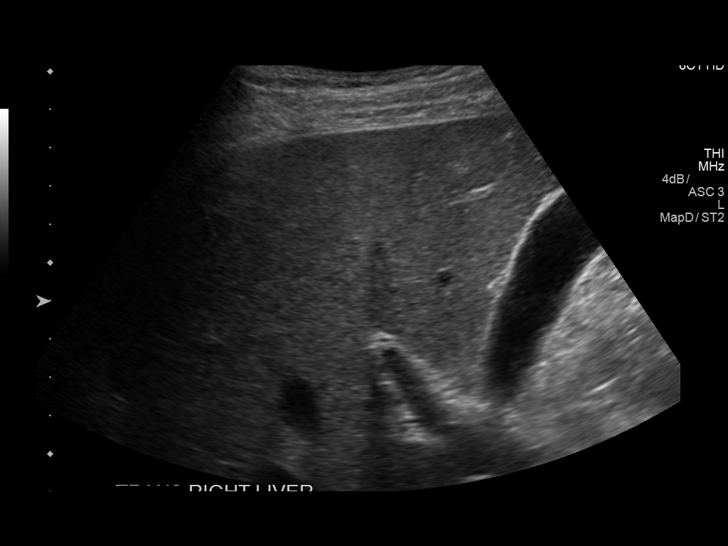
[im 2/13]
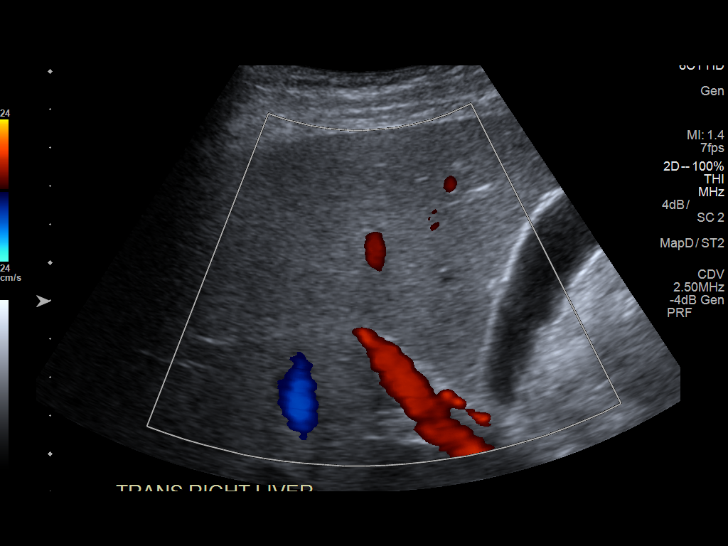
[im 3/13]
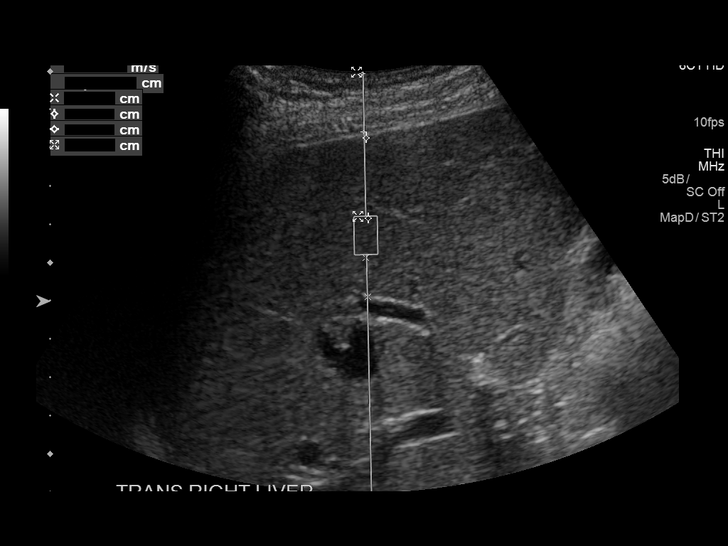
[im 4/13]
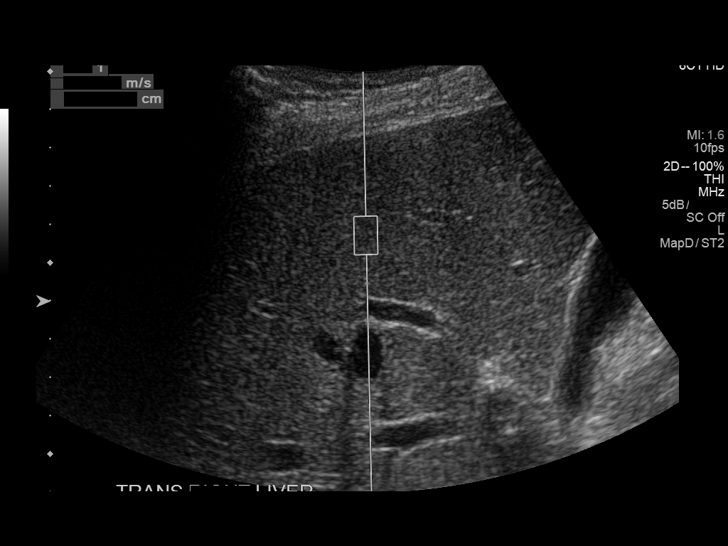
[im 5/13]
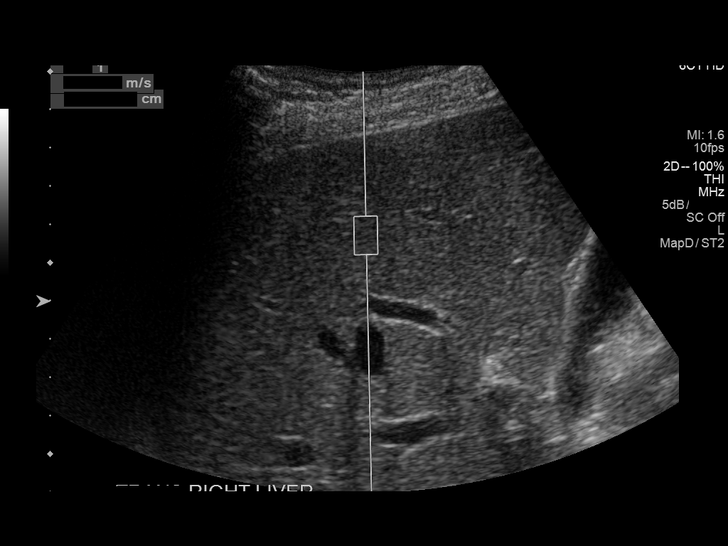
[im 6/13]
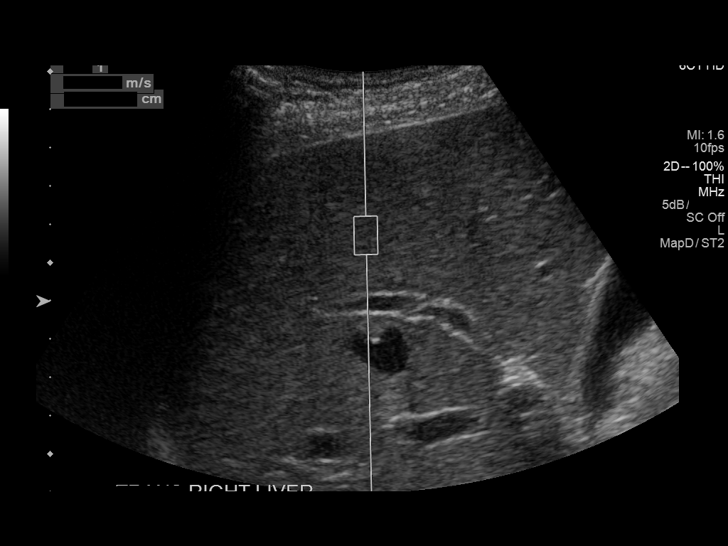
[im 7/13]
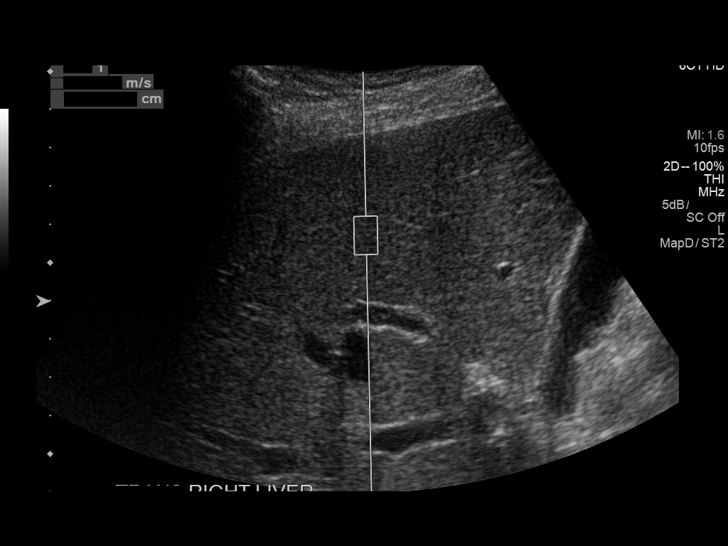
[im 8/13]
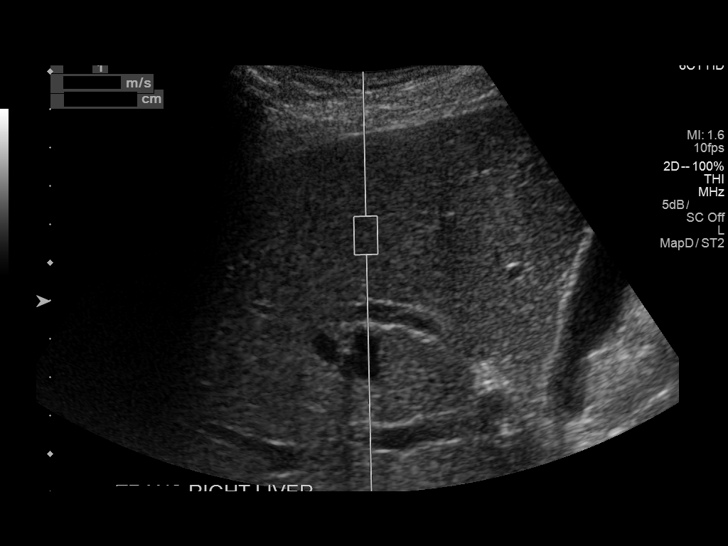
[im 9/13]
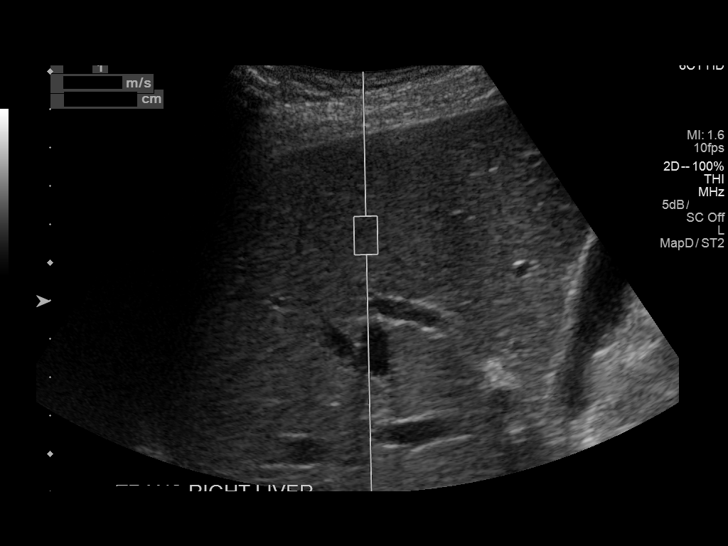
[im 10/13]
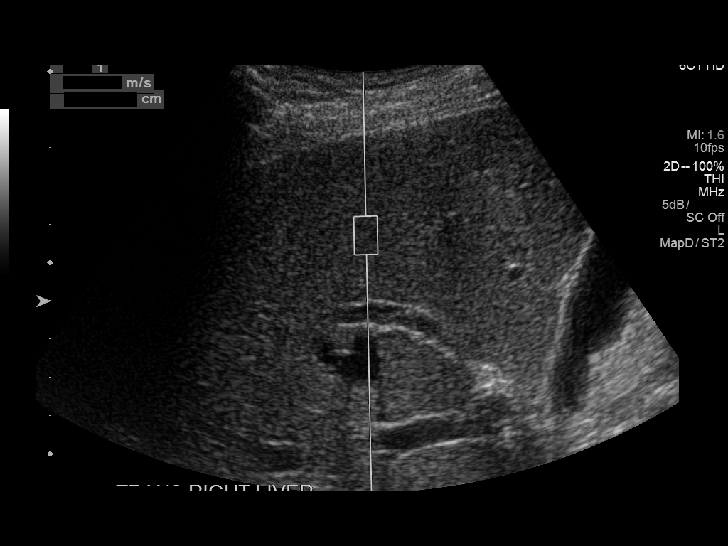
[im 11/13]
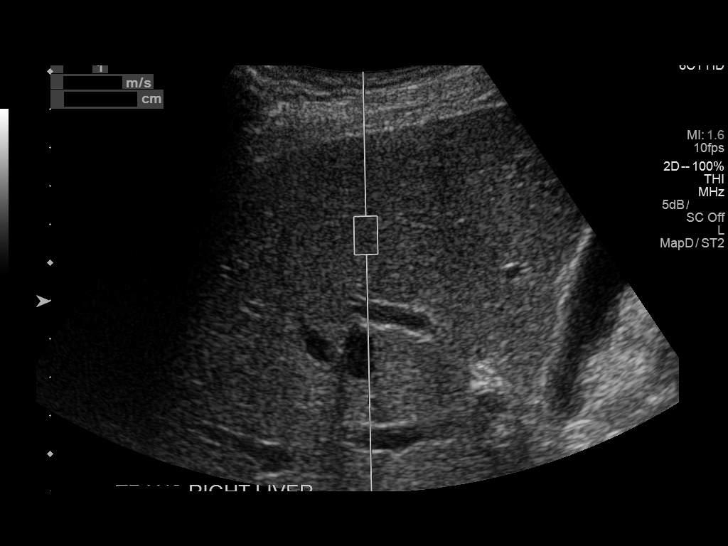
[im 12/13]
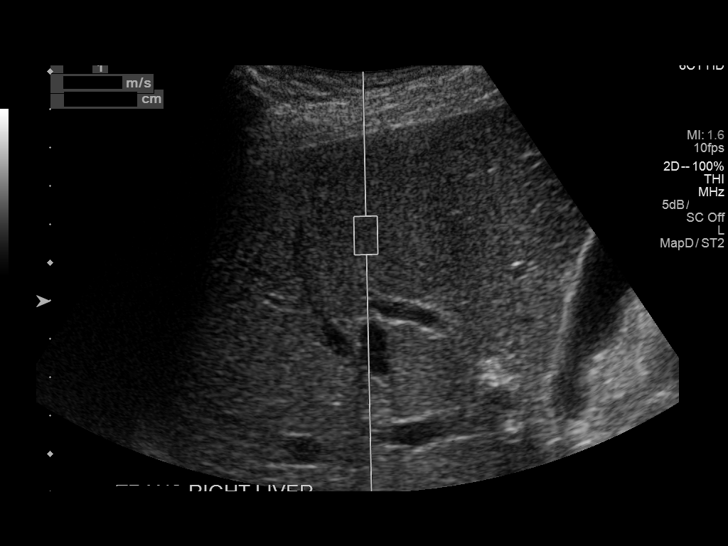
[im 13/13]
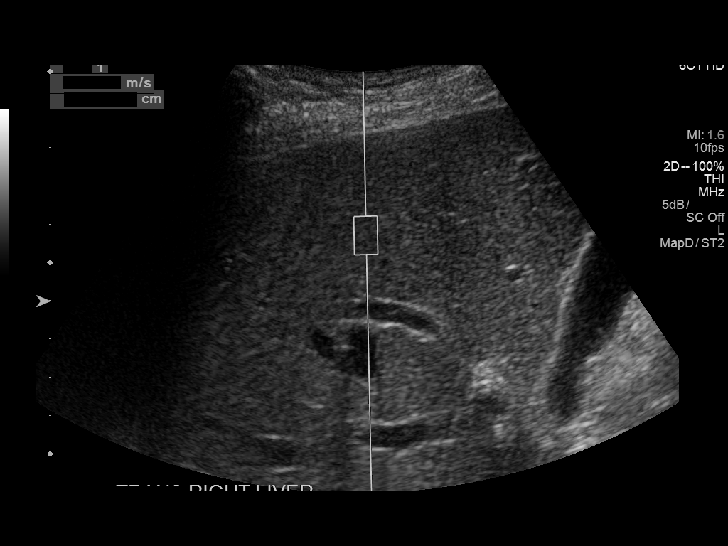

[13 of 13 positions shown; findings below may reference images not displayed]

FINDINGS: ULTRASOUND ABDOMEN

Gallbladder: No gallstones or wall thickening visualized. No
sonographic Murphy sign noted by sonographer.

Common bile duct: Diameter: 4 mm

Liver: Liver parenchymal echotexture and echogenicity are within
normal limits. No definite liver surface irregularity. No liver mass
detected.

IVC: No abnormality visualized.

Pancreas: Visualized portion unremarkable.

Spleen: Size and appearance within normal limits.

Right Kidney: Length: 11.4 cm. Echogenicity within normal limits. No
mass or hydronephrosis visualized.

Left Kidney: Length: 12.1 cm. Echogenicity within normal limits. No
mass or hydronephrosis visualized.

Abdominal aorta: No aneurysm visualized.

Other findings: None.

ULTRASOUND HEPATIC ELASTOGRAPHY

Device: Siemens Helix VTQ

Patient position:  Supine

Transducer 6C1

Number of measurements:  10

Hepatic Segment:  8

Median velocity:   1.12  m/sec

IQR:

IQR/Median velocity ratio

Corresponding Metavir fibrosis score:  F0/F1

Risk of fibrosis: Minimal

Limitations of exam: None

Pertinent findings noted on other imaging exams:  None

Please note that abnormal shear wave velocities may also be
identified in clinical settings other than with hepatic fibrosis,
such as: acute hepatitis, elevated right heart and central venous
pressures including use of beta blockers, Noella disease
([place]), infiltrative processes such as
mastocytosis/amyloidosis/infiltrative tumor, extrahepatic
cholestasis, in the post-prandial state, and liver transplantation.
Correlation with patient history, laboratory data, and clinical
condition recommended.
IMPRESSION: 1. Normal abdominal sonogram.
2. Hepatic elastography results:

Median hepatic shear wave velocity is calculated at 1.12 m/sec.

Corresponding Metavir fibrosis score is F0/F1.

Risk of fibrosis is minimal.

Follow-up:  None required

## 2018-03-28 ENCOUNTER — Emergency Department
Admission: EM | Admit: 2018-03-28 | Discharge: 2018-03-28 | Disposition: A | Discharge status: Home / Self Care | Payer: Medicare Other | Attending: Emergency Medicine | Admitting: Emergency Medicine

## 2018-03-28 ENCOUNTER — Other Ambulatory Visit: Payer: Self-pay

## 2018-03-28 ENCOUNTER — Encounter: Payer: Self-pay | Admitting: Emergency Medicine

## 2018-03-28 DIAGNOSIS — Z79899 Other long term (current) drug therapy: Secondary | ICD-10-CM | POA: Diagnosis not present

## 2018-03-28 DIAGNOSIS — Z7689 Persons encountering health services in other specified circumstances: Secondary | ICD-10-CM

## 2018-03-28 DIAGNOSIS — F17200 Nicotine dependence, unspecified, uncomplicated: Secondary | ICD-10-CM | POA: Insufficient documentation

## 2018-03-28 DIAGNOSIS — J449 Chronic obstructive pulmonary disease, unspecified: Secondary | ICD-10-CM | POA: Insufficient documentation

## 2018-03-28 DIAGNOSIS — F209 Schizophrenia, unspecified: Secondary | ICD-10-CM | POA: Diagnosis not present

## 2018-03-28 MED ORDER — HALOPERIDOL DECANOATE 100 MG/ML IM SOLN
100.0000 mg | Freq: Once | INTRAMUSCULAR | Status: AC
  Administered 2018-03-28: 100 mg via INTRAMUSCULAR

## 2018-03-28 NOTE — ED Provider Notes (Signed)
[facility] Emergency Department Provider Note   ____________________________________________   First MD Initiated Contact with Patient 03/28/18 1425     (approximate)  I have reviewed the triage vital signs and the nursing notes.   HISTORY  Chief Complaint Haldol injection    HPI Paul Summers is a 57 y.o. male patient here today for Haldol injection.  Patient state injections normally given by [facility] home but they were not available to do it today.  Patient is injection is for schizophrenia.  Patient is here with his caregiver who is in possession of the medication.  Past Medical History:  Diagnosis Date  . COPD (chronic obstructive pulmonary disease) ([place])   . GERD (gastroesophageal reflux disease)   . Hepatitis   . Hyperlipidemia   . Schizophrenia ([place])     There are no active problems to display for this patient.   Past Surgical History:  Procedure Laterality Date  . COLONOSCOPY    . COLONOSCOPY WITH PROPOFOL N/A 09/25/2015   Procedure: COLONOSCOPY WITH PROPOFOL;  Surgeon: [name], MD;  Location: [facility] ENDOSCOPY;  Service: Endoscopy;  Laterality: N/A;  . COLONOSCOPY WITH PROPOFOL N/A 09/29/2015   Procedure: COLONOSCOPY WITH PROPOFOL;  Surgeon: [name], MD;  Location: [facility] ENDOSCOPY;  Service: Endoscopy;  Laterality: N/A;  . FOREARM SURGERY    . TOE SURGERY      Prior to Admission medications   Medication Sig Start Date End Date Taking? Authorizing Provider  benztropine (COGENTIN) 1 MG tablet Take 1 mg by mouth 2 (two) times daily.    [provider]  cholecalciferol (VITAMIN D) 1000 units tablet Take 1,000 Units by mouth daily.    [provider]  divalproex (DEPAKOTE ER) 500 MG 24 hr tablet Take 1,000 mg by mouth daily.    [provider]  fenofibrate 160 MG tablet Take 160 mg by mouth daily.    [provider]  Fish Oil-Cholecalciferol (OMEGA-3 FISH OIL/VITAMIN D3) 1000-1000 MG-UNIT  CAPS Take by mouth.    [provider]  Fluticasone-Salmeterol (ADVAIR) 250-50 MCG/DOSE AEPB Inhale 1 puff into the lungs 2 (two) times daily.    [provider]  haloperidol lactate (HALDOL) 5 MG/ML injection 5 mg.    [provider]  LORazepam (ATIVAN) 1 MG tablet Take 1 mg by mouth every 8 (eight) hours.    [provider]  OLANZapine (ZYPREXA) 20 MG tablet Take 20 mg by mouth at bedtime.    [provider]  omeprazole (PRILOSEC) 20 MG capsule Take 20 mg by mouth daily.    [provider]  Skin Protectants, Misc. (EUCERIN) cream Apply topically as needed for dry skin.    [provider]    Allergies Ether and Sulfa antibiotics  No family history on file.  Social History Social History   Tobacco Use  . Smoking status: Current Every Day Smoker  . Smokeless tobacco: Never Used  Substance Use Topics  . Alcohol use: No  . Drug use: No    Review of Systems  Constitutional: No fever/chills Eyes: No visual changes. ENT: No sore throat. Cardiovascular: Denies chest pain. Respiratory: Denies shortness of breath. Gastrointestinal: No abdominal pain.  No nausea, no vomiting.  No diarrhea.  No constipation. Genitourinary: Negative for dysuria. Musculoskeletal: Negative for back pain. Skin: Negative for rash. Neurological: Negative for headaches, focal weakness or numbness. Psychiatric:Schizophrenia. Endocrine:Hepatitis and hyperlipidemia. Allergic/Immunilogical: Sulfa medications. ____________________________________________   PHYSICAL EXAM:  VITAL SIGNS: ED Triage Vitals  Enc Vitals Group     BP 03/28/18 1406 131/77     Pulse Rate 03/28/18 1406 80     Resp 03/28/18 1406 20     Temp 03/28/18 1406 97.9 F (36.6 C)     Temp Source 03/28/18 1406 Oral     SpO2 03/28/18 1406 98 %     Weight 03/28/18 1407 170 lb (77.1 kg)     Height 03/28/18 1407 5\' 10"  (1.778 m)     Head Circumference --      Peak Flow --       Pain Score 03/28/18 1410 0     Pain Loc --      Pain Edu? --      Excl. in [place]? --     Constitutional: Alert and oriented. Well appearing and in no acute distress. Hematological/Lymphatic/Immunilogical: No cervical lymphadenopathy. Cardiovascular: Normal rate, regular rhythm. Grossly normal heart sounds.  Good peripheral circulation. Respiratory: Normal respiratory effort.  No retractions. Lungs CTAB. Skin:  Skin is warm, dry and intact. No rash noted. Psychiatric: Mood and affect are normal. Speech and behavior are normal.  ____________________________________________   LABS (all labs ordered are listed, but only abnormal results are displayed)  Labs Reviewed - No data to display ____________________________________________  EKG   ____________________________________________  RADIOLOGY  ED MD interpretation:    Official radiology report(s): No results found.  ____________________________________________   PROCEDURES  Procedure(s) performed: None  Procedures  Critical Care performed: No  ____________________________________________   INITIAL IMPRESSION / ASSESSMENT AND PLAN / ED COURSE  As part of my medical decision making, I reviewed the following data within the [place]    Patient presents with request for Haldol injection due to inability to clinic to give injection today.  100 mg was given IM and patient will depart after 20-minute wait time.   ____________________________________________   FINAL CLINICAL IMPRESSION(S) / ED DIAGNOSES  Final diagnoses:  Encounter for medication administration     ED Discharge Orders    None       Note:  This document was prepared using Dragon voice recognition software and may include unintentional dictation errors.    Sable Feil, PA-C 03/28/18 1446    Eula Listen, MD 03/28/18 [phone]

## 2018-03-28 NOTE — ED Triage Notes (Signed)
Pt states he is here for a haldol injection. Pt denies pain. Pt states he usually goes to [facility] home but states they could not "do it today."

## 2018-03-28 NOTE — ED Notes (Signed)
See triage note  Presents with care giver  Needs Haldol injection    Medication with pt

## 2018-03-28 NOTE — ED Triage Notes (Signed)
Per caregiver patient receives injection for psych.  But [facility] would not give the injection today and told them it would be 3 weeks.

## 2019-06-21 ENCOUNTER — Ambulatory Visit: Payer: Medicare Other | Attending: Internal Medicine

## 2019-06-21 ENCOUNTER — Ambulatory Visit: Payer: Medicare Other

## 2019-06-21 DIAGNOSIS — Z20822 Contact with and (suspected) exposure to covid-19: Secondary | ICD-10-CM

## 2019-06-22 LAB — NOVEL CORONAVIRUS, NAA: SARS-CoV-2, NAA: NOT DETECTED

## 2019-06-24 ENCOUNTER — Ambulatory Visit: Payer: Self-pay

## 2019-07-06 ENCOUNTER — Ambulatory Visit: Payer: Medicare Other

## 2020-03-03 ENCOUNTER — Other Ambulatory Visit: Payer: Medicare Other

## 2020-03-03 ENCOUNTER — Other Ambulatory Visit: Payer: Self-pay

## 2020-03-03 DIAGNOSIS — Z20822 Contact with and (suspected) exposure to covid-19: Secondary | ICD-10-CM

## 2020-03-04 LAB — NOVEL CORONAVIRUS, NAA: SARS-CoV-2, NAA: NOT DETECTED

## 2020-03-04 LAB — SARS-COV-2, NAA 2 DAY TAT

## 2021-07-28 ENCOUNTER — Other Ambulatory Visit: Payer: Self-pay

## 2021-07-29 ENCOUNTER — Telehealth: Payer: Self-pay

## 2021-07-29 NOTE — Telephone Encounter (Signed)
Called patient phone just rang no answer letter sent out ?

## 2021-08-10 NOTE — Telephone Encounter (Signed)
Pt returned call left message that his address has change new address is updated in system would like a call back to reshed appt ?

## 2021-09-08 ENCOUNTER — Telehealth: Payer: Self-pay | Admitting: Gastroenterology

## 2021-09-08 NOTE — Telephone Encounter (Signed)
Pt called and left VM at [facility] office, returned call to phone # provided [phone] and received VM, I left message to advise I was returning call and he can call us back  ?

## 2021-09-20 ENCOUNTER — Other Ambulatory Visit: Payer: Self-pay

## 2021-09-20 ENCOUNTER — Telehealth: Payer: Self-pay

## 2021-09-20 DIAGNOSIS — Z86010 Personal history of colonic polyps: Secondary | ICD-10-CM

## 2021-09-20 MED ORDER — NA SULFATE-K SULFATE-MG SULF 17.5-3.13-1.6 GM/177ML PO SOLN: 1.0000 | Freq: Once | ORAL | 0 refills | Status: AC

## 2021-09-20 NOTE — Telephone Encounter (Signed)
Gastroenterology Pre-Procedure Review ? ?Request Date: 11/01/21 ?Requesting Physician: Dr. Vicente Males ? ?PATIENT REVIEW QUESTIONS: The patient responded to the following health history questions as indicated:   ? ?1. Are you having any GI issues? no ?2. Do you have a personal history of Polyps? yes (09/29/15 colonoscopy performed by Dr. Gustavo Lah polyps noted) ?3. Do you have a family history of Colon Cancer or Polyps? no ?4. Diabetes Mellitus? yes (type 2) ?5. Joint replacements in the past 12 months?no ?6. Major health problems in the past 3 months?no ?7. Any artificial heart valves, MVP, or defibrillator?no ?   ?MEDICATIONS & ALLERGIES:    ?Patient reports the following regarding taking any anticoagulation/antiplatelet therapy:   ?Plavix, Coumadin, Eliquis, Xarelto, Lovenox, Pradaxa, Brilinta, or Effient? no ?Aspirin? no ? ?Patient confirms/reports the following medications:  ?Current Outpatient Medications  ?Medication Sig Dispense Refill  ? benztropine (COGENTIN) 1 MG tablet Take 1 mg by mouth 2 (two) times daily.    ? cholecalciferol (VITAMIN D) 1000 units tablet Take 1,000 Units by mouth daily.    ? divalproex (DEPAKOTE ER) 500 MG 24 hr tablet Take 1,000 mg by mouth daily.    ? fenofibrate 160 MG tablet Take 160 mg by mouth daily.    ? Fish Oil-Cholecalciferol (OMEGA-3 FISH OIL/VITAMIN D3) 1000-1000 MG-UNIT CAPS Take by mouth.    ? Fluticasone-Salmeterol (ADVAIR) 250-50 MCG/DOSE AEPB Inhale 1 puff into the lungs 2 (two) times daily.    ? haloperidol lactate (HALDOL) 5 MG/ML injection 5 mg.    ? LORazepam (ATIVAN) 1 MG tablet Take 1 mg by mouth every 8 (eight) hours.    ? OLANZapine (ZYPREXA) 20 MG tablet Take 20 mg by mouth at bedtime.    ? omeprazole (PRILOSEC) 20 MG capsule Take 20 mg by mouth daily.    ? Skin Protectants, Misc. (EUCERIN) cream Apply topically as needed for dry skin.    ? ?No current facility-administered medications for this visit.  ? ? ?Patient confirms/reports the following allergies:   ?Allergies  ?Allergen Reactions  ? Ether   ? Sulfa Antibiotics   ? ? ?No orders of the defined types were placed in this encounter. ? ? ?AUTHORIZATION INFORMATION ?Primary Insurance: ?1D#: ?Group #: ? ?Secondary Insurance: ?1D#: ?Group #: ? ?SCHEDULE INFORMATION: ?Date: 11/01/21 ?Time: ?Location: [facility] ?

## 2021-10-04 ENCOUNTER — Telehealth: Payer: Self-pay | Admitting: Gastroenterology

## 2021-10-04 NOTE — Telephone Encounter (Signed)
Pt cancelled up coming procedure . ?

## 2021-11-01 ENCOUNTER — Ambulatory Visit: Admission: RE | Admit: 2021-11-01 | Payer: Medicare HMO | Source: Home / Self Care | Admitting: Gastroenterology

## 2021-11-01 ENCOUNTER — Encounter: Admission: RE | Payer: Self-pay | Source: Home / Self Care

## 2021-11-01 SURGERY — COLONOSCOPY WITH PROPOFOL
Anesthesia: General

## 2022-02-14 ENCOUNTER — Encounter: Payer: Self-pay | Admitting: Student in an Organized Health Care Education/Training Program

## 2022-02-14 ENCOUNTER — Other Ambulatory Visit: Payer: Self-pay

## 2022-02-14 ENCOUNTER — Ambulatory Visit (INDEPENDENT_AMBULATORY_CARE_PROVIDER_SITE_OTHER): Payer: Medicare HMO | Admitting: Student in an Organized Health Care Education/Training Program

## 2022-02-14 ENCOUNTER — Ambulatory Visit
Admission: RE | Admit: 2022-02-14 | Discharge: 2022-02-14 | Disposition: A | Discharge status: Home / Self Care | Payer: Self-pay | Source: Ambulatory Visit | Attending: Student in an Organized Health Care Education/Training Program | Admitting: Student in an Organized Health Care Education/Training Program

## 2022-02-14 ENCOUNTER — Telehealth: Payer: Self-pay

## 2022-02-14 VITALS — BP 122/74 | HR 75 | Temp 98.4°F | Ht 70.0 in | Wt 207.0 lb

## 2022-02-14 DIAGNOSIS — R911 Solitary pulmonary nodule: Secondary | ICD-10-CM

## 2022-02-14 DIAGNOSIS — J449 Chronic obstructive pulmonary disease, unspecified: Secondary | ICD-10-CM | POA: Diagnosis not present

## 2022-02-14 NOTE — Patient Instructions (Signed)
Today, I ordered a repeat CT chest to evaluate your lung for the nodule. I also ordered an imaging study that would let me know if there are any other spots (PET). I also ordered a breathing test to evaluate your breathing. I would like to see you in 2 weeks after these tests have been done.

## 2022-02-14 NOTE — Progress Notes (Signed)
Synopsis: Referred in for pulmonary nodule by Ziglar, Lincoln Brigham, MD  Assessment & Plan:   1. Lung nodule  Nodule Location: RUL Nodule Size:1.8 cm x 1 cm Nodule Spiculation: No Associated Lymphadenopathy (no) Smoking Status (current) and pack years (100) Extrathoracic cancer > 5 years prior (no) SPN malignancy risk score [facility]): 36 % risk of malignancy ECOG: 1  Presents with a 1.8 cm x 1 cm lobulated nodule in the RUL that was noted on CT from 09/15/2021 that was ported over to our PACS through [facility] and I was able to review. There was suggestion of possible macroscopic fat on the CT read but this was limited given the low dose technique.  Given the risk of malignancy of said nodule is elevated at 36%, he would benefit from biopsy. We discussed the importance of diagnosis and staging in lung malignancies, and the approach to obtaining a tissue diagnosis which would include robotic assisted navigational bronchoscopy with endobronchial ultrasound guided sampling. Given that the scan was from April, I will elect to repeat a CT of the chest as well as obtain a PET/CT to assess for any extrathoracic spread. Patient will follow up with me next week after he's underwent his testing.   - CT CHEST WO CONTRAST; Future - Pulmonary Function Test [facility] Only; Future - NM PET Image Initial (PI) Skull Base To Thigh (F-18 FDG); Future  2. Chronic obstructive pulmonary disease, unspecified COPD type ([place])  He has a long standing history of smoking and reports exertional dyspnea. Suspect a component of COPD. He will require PFT's to assess for COPD as well as risk for surgery should he require a lobectomy.  - Pulmonary Function Test [facility] Only; Future  Return in about 2 weeks (around 02/28/2022).  I spent 60 minutes caring for this patient today, including preparing to see the patient, obtaining and/or reviewing separately obtained history, performing a medically appropriate examination  and/or evaluation, counseling and educating the patient/family/caregiver, ordering medications, tests, or procedures, documenting clinical information in the electronic health record, independently interpreting results (not separately reported/billed) and communicating results to the patient/family/caregiver, and care coordination (not separately reported/billed)  Armando Reichert, MD [facility] 02/14/2022 4:06 PM    End of visit medications:  No orders of the defined types were placed in this encounter.    Current Outpatient Medications:    atorvastatin (LIPITOR) 40 MG tablet, Take by mouth., Disp: , Rfl:    cholecalciferol (VITAMIN D) 1000 units tablet, Take 1,000 Units by mouth daily., Disp: , Rfl:    haloperidol decanoate (HALDOL DECANOATE) 100 MG/ML injection, Inject into the muscle., Disp: , Rfl:    haloperidol lactate (HALDOL) 5 MG/ML injection, 5 mg., Disp: , Rfl:    metFORMIN (GLUCOPHAGE) 500 MG tablet, Take by mouth., Disp: , Rfl:    OLANZapine (ZYPREXA) 20 MG tablet, Take 20 mg by mouth at bedtime., Disp: , Rfl:    albuterol (VENTOLIN HFA) 108 (90 Base) MCG/ACT inhaler, Inhale into the lungs. (Patient not taking: Reported on 02/14/2022), Disp: , Rfl:    Cholecalciferol 25 MCG (1000 UT) capsule, Take by mouth. (Patient not taking: Reported on 02/14/2022), Disp: , Rfl:    divalproex (DEPAKOTE) 500 MG DR tablet, Take by mouth. (Patient not taking: Reported on 02/14/2022), Disp: , Rfl:    fenofibrate 160 MG tablet, Take 160 mg by mouth daily. (Patient not taking: Reported on 02/14/2022), Disp: , Rfl:    Fish Oil-Cholecalciferol (OMEGA-3 FISH OIL/VITAMIN D3) 1000-1000 MG-UNIT CAPS,  Take by mouth. (Patient not taking: Reported on 02/14/2022), Disp: , Rfl:    LORazepam (ATIVAN) 1 MG tablet, Take by mouth. (Patient not taking: Reported on 02/14/2022), Disp: , Rfl:    QUEtiapine (SEROQUEL) 100 MG tablet, Take by mouth. (Patient not taking: Reported on 02/14/2022), Disp: , Rfl:     tiotropium (SPIRIVA) 18 MCG inhalation capsule, Place into inhaler and inhale. (Patient not taking: Reported on 02/14/2022), Disp: , Rfl:    Subjective:   PATIENT ID: Paul Summers GENDER: male DOB: 02/10/1961, MRN: [id number]  Chief Complaint  Patient presents with   pulmonary consult    Recent CT. Dry cough and SOB in the morning.     HPI Paul Summers is a 61 year old male presenting to clinic for the evaluation of a pulmonary nodule.  He only mild symptoms of shortness of breath (with exertion). He has no cough, no hemoptysis, and no sputum production. He reports occasional chills, but has otherwise been in his usual state of health. He reports smoking 2 packs a day for the past 50 years, and has recently cut back to 1 pack a day. He has no interest in quitting smoking. He reports that he was institutionalized for psychiatric illness for 30 years. He is maintained on halopreidol, olanzapine, and valbenazine.  He tells me that his PCP had obtained a CT of the chest in April of 2023 that showed a pulmonary nodule. He saw his PCP recently and was referred to pulmonary for an evaluation. Images were ported through powershare from the [facility] PACS system.  Ancillary information including prior medications, full medical/surgical/family/social histories, and PFTs (when available) are listed below and have been reviewed.   Review of Systems  Constitutional:  Positive for weight loss. Negative for chills and fever.  Respiratory:  Positive for cough and shortness of breath. Negative for hemoptysis and sputum production.   Cardiovascular:  Negative for chest pain and palpitations.     Objective:   Vitals:   02/14/22 1512  BP: 122/74  Pulse: 75  Temp: 98.4 F (36.9 C)  TempSrc: Temporal  SpO2: 95%  Weight: 207 lb (93.9 kg)  Height: '5\' 10"'$  (1.778 m)   95% on RA  BMI Readings from Last 3 Encounters:  02/14/22 29.70 kg/m  03/28/18 24.39 kg/m  06/09/17 24.39 kg/m   Wt Readings from  Last 3 Encounters:  02/14/22 207 lb (93.9 kg)  03/28/18 170 lb (77.1 kg)  06/09/17 170 lb (77.1 kg)    Physical Exam Constitutional:      Appearance: He is obese.  HENT:     Head: Normocephalic and atraumatic.     Nose: Nose normal.     Mouth/Throat:     Mouth: Mucous membranes are moist.  Cardiovascular:     Rate and Rhythm: Normal rate and regular rhythm.     Pulses: Normal pulses.     Heart sounds: Normal heart sounds.  Pulmonary:     Effort: Pulmonary effort is normal.     Breath sounds: Normal breath sounds.     Comments: No axillary lymphadenopathy Chest:     Chest wall: No tenderness.  Abdominal:     General: There is distension.     Palpations: Abdomen is soft.  Musculoskeletal:     Cervical back: Neck supple.  Lymphadenopathy:     Cervical: No cervical adenopathy.  Neurological:     General: No focal deficit present.     Mental Status: He is alert and oriented to person,  place, and time. Mental status is at baseline.       Ancillary Information    Past Medical History:  Diagnosis Date   COPD (chronic obstructive pulmonary disease) (HCC)    GERD (gastroesophageal reflux disease)    Hepatitis    Hyperlipidemia    Schizophrenia ([place])      Family History  Problem Relation Age of Onset   Lung cancer Mother    Pneumonia Maternal Grandmother      Past Surgical History:  Procedure Laterality Date   COLONOSCOPY     COLONOSCOPY WITH PROPOFOL N/A 09/25/2015   Procedure: COLONOSCOPY WITH PROPOFOL;  Surgeon: [name], MD;  Location: [facility] ENDOSCOPY;  Service: Endoscopy;  Laterality: N/A;   COLONOSCOPY WITH PROPOFOL N/A 09/29/2015   Procedure: COLONOSCOPY WITH PROPOFOL;  Surgeon: [name], MD;  Location: [facility] ENDOSCOPY;  Service: Endoscopy;  Laterality: N/A;   FOREARM SURGERY     TOE SURGERY      Social History   Socioeconomic History   Marital status: Single    Spouse name: Not on file   Number of children: Not on file   Years of  education: Not on file   Highest education level: Not on file  Occupational History   Not on file  Tobacco Use   Smoking status: Every Day    Packs/day: 2.00    Years: 50.00    Total pack years: 100.00    Types: Cigarettes   Smokeless tobacco: Never   Tobacco comments:    1PPD 02/14/2022  Substance and Sexual Activity   Alcohol use: Yes    Comment: 1-2 cans once weekly   Drug use: No   Sexual activity: Not on file  Other Topics Concern   Not on file  Social History Narrative   Not on file   Social Determinants of Health   Financial Resource Strain: Not on file  Food Insecurity: Not on file  Transportation Needs: Not on file  Physical Activity: Not on file  Stress: Not on file  Social Connections: Not on file  Intimate Partner Violence: Not on file     Allergies  Allergen Reactions   Ether Anaphylaxis   Other Swelling   Sulfa Antibiotics      CBC    Component Value Date/Time   WBC 4.0 09/25/2015 1304   RBC 4.62 09/25/2015 1304   HGB 13.7 09/25/2015 1304   HCT 40.3 09/25/2015 1304   PLT 299 09/25/2015 1304   MCV 87.1 09/25/2015 1304   MCH 29.6 09/25/2015 1304   MCHC 33.9 09/25/2015 1304   RDW 14.3 09/25/2015 1304    Pulmonary Functions Testing Results:     No data to display          Outpatient Medications Prior to Visit  Medication Sig Dispense Refill   atorvastatin (LIPITOR) 40 MG tablet Take by mouth.     cholecalciferol (VITAMIN D) 1000 units tablet Take 1,000 Units by mouth daily.     haloperidol decanoate (HALDOL DECANOATE) 100 MG/ML injection Inject into the muscle.     haloperidol lactate (HALDOL) 5 MG/ML injection 5 mg.     metFORMIN (GLUCOPHAGE) 500 MG tablet Take by mouth.     OLANZapine (ZYPREXA) 20 MG tablet Take 20 mg by mouth at bedtime.     omeprazole (PRILOSEC) 20 MG capsule Take 20 mg by mouth daily.     omeprazole (PRILOSEC) 40 MG capsule Take by mouth.     albuterol (VENTOLIN HFA) 108 (90 Base)  MCG/ACT inhaler Inhale into the  lungs. (Patient not taking: Reported on 02/14/2022)     Cholecalciferol 25 MCG (1000 UT) capsule Take by mouth. (Patient not taking: Reported on 02/14/2022)     divalproex (DEPAKOTE) 500 MG DR tablet Take by mouth. (Patient not taking: Reported on 02/14/2022)     fenofibrate 160 MG tablet Take 160 mg by mouth daily. (Patient not taking: Reported on 02/14/2022)     Fish Oil-Cholecalciferol (OMEGA-3 FISH OIL/VITAMIN D3) 1000-1000 MG-UNIT CAPS Take by mouth. (Patient not taking: Reported on 02/14/2022)     LORazepam (ATIVAN) 1 MG tablet Take by mouth. (Patient not taking: Reported on 02/14/2022)     QUEtiapine (SEROQUEL) 100 MG tablet Take by mouth. (Patient not taking: Reported on 02/14/2022)     tiotropium (SPIRIVA) 18 MCG inhalation capsule Place into inhaler and inhale. (Patient not taking: Reported on 02/14/2022)     No facility-administered medications prior to visit.

## 2022-02-14 NOTE — Telephone Encounter (Signed)
Requested images via power share.

## 2022-02-14 NOTE — Telephone Encounter (Signed)
Images have been uploaded. Dr. Genia Harold has been made aware.

## 2022-03-03 ENCOUNTER — Ambulatory Visit
Admission: RE | Admit: 2022-03-03 | Discharge: 2022-03-03 | Disposition: A | Discharge status: Home / Self Care | Payer: Medicare HMO | Source: Ambulatory Visit | Attending: Student in an Organized Health Care Education/Training Program | Admitting: Student in an Organized Health Care Education/Training Program

## 2022-03-03 DIAGNOSIS — R911 Solitary pulmonary nodule: Secondary | ICD-10-CM

## 2022-03-03 DIAGNOSIS — J449 Chronic obstructive pulmonary disease, unspecified: Secondary | ICD-10-CM | POA: Insufficient documentation

## 2022-03-03 DIAGNOSIS — R918 Other nonspecific abnormal finding of lung field: Secondary | ICD-10-CM | POA: Insufficient documentation

## 2022-03-03 DIAGNOSIS — I2510 Atherosclerotic heart disease of native coronary artery without angina pectoris: Secondary | ICD-10-CM | POA: Insufficient documentation

## 2022-03-03 LAB — GLUCOSE, CAPILLARY: Glucose-Capillary: 113 mg/dL — ABNORMAL HIGH (ref 70–99)

## 2022-03-03 MED ORDER — FLUDEOXYGLUCOSE F - 18 (FDG) INJECTION
10.7000 | Freq: Once | INTRAVENOUS | Status: AC
  Administered 2022-03-03: 11.49 via INTRAVENOUS

## 2022-03-07 ENCOUNTER — Encounter: Payer: Self-pay | Admitting: Student in an Organized Health Care Education/Training Program

## 2022-03-07 ENCOUNTER — Ambulatory Visit (INDEPENDENT_AMBULATORY_CARE_PROVIDER_SITE_OTHER): Payer: Medicare HMO | Admitting: Student in an Organized Health Care Education/Training Program

## 2022-03-07 VITALS — BP 118/74 | HR 78 | Temp 97.4°F | Ht 70.0 in | Wt 207.6 lb

## 2022-03-07 DIAGNOSIS — R911 Solitary pulmonary nodule: Secondary | ICD-10-CM

## 2022-03-07 NOTE — Progress Notes (Signed)
Synopsis: Follow up on pulmonary nodule  Assessment & Plan:   1. Lung nodule  He presents back with a repeat CT showing a RUL nodule that appears to be stable compared to his prior CT. The nodule is not FDG avid. There is the possibility that this is an indolent malignancy vs. Carcinoid tumor vs. Other inflammatory or infectious nodule. Paul Summers is not keen on having any surgery or resection done for this nodule, should it turn out to be malignant. He is also not keen on having his code status changed for the procedure given his advanced directive. His [facility] SPN risk for malignancy returns at 3.2%.  Given concern for possible carcinoid, I will proceed with a dotatate PET/CT. I will also continue with short term radiographic monitoring of the nodule with repeat imaging. I will discuss biopsy again with the patient at follow up.  - NM PET (NETSPOT GA 68 DOTATATE) SKULL BASE TO MID THIGH; Future   Return in about 3 months (around 06/07/2022).  I spent 30 minutes caring for this patient today, including preparing to see the patient, obtaining and/or reviewing separately obtained history, performing a medically appropriate examination and/or evaluation, counseling and educating the patient/family/caregiver, ordering medications, tests, or procedures, and documenting clinical information in the electronic health record  Armando Reichert, MD [facility] 03/07/2022 5:28 PM    End of visit medications:  No orders of the defined types were placed in this encounter.    Current Outpatient Medications:    atorvastatin (LIPITOR) 40 MG tablet, Take by mouth., Disp: , Rfl:    cholecalciferol (VITAMIN D) 1000 units tablet, Take 1,000 Units by mouth daily., Disp: , Rfl:    haloperidol decanoate (HALDOL DECANOATE) 100 MG/ML injection, Inject 100 mg into the muscle every 28 (twenty-eight) days., Disp: , Rfl:    LORazepam (ATIVAN) 1 MG tablet, Take by mouth., Disp: , Rfl:     metFORMIN (GLUCOPHAGE) 500 MG tablet, Take by mouth., Disp: , Rfl:    OLANZapine (ZYPREXA) 20 MG tablet, Take 20 mg by mouth at bedtime., Disp: , Rfl:    albuterol (VENTOLIN HFA) 108 (90 Base) MCG/ACT inhaler, Inhale into the lungs. (Patient not taking: Reported on 02/14/2022), Disp: , Rfl:    Cholecalciferol 25 MCG (1000 UT) capsule, Take by mouth. (Patient not taking: Reported on 02/14/2022), Disp: , Rfl:    divalproex (DEPAKOTE) 500 MG DR tablet, Take by mouth. (Patient not taking: Reported on 02/14/2022), Disp: , Rfl:    fenofibrate 160 MG tablet, Take 160 mg by mouth daily. (Patient not taking: Reported on 03/07/2022), Disp: , Rfl:    Fish Oil-Cholecalciferol (OMEGA-3 FISH OIL/VITAMIN D3) 1000-1000 MG-UNIT CAPS, Take by mouth. (Patient not taking: Reported on 02/14/2022), Disp: , Rfl:    haloperidol lactate (HALDOL) 5 MG/ML injection, 5 mg. (Patient not taking: Reported on 03/07/2022), Disp: , Rfl:    INGREZZA 80 MG capsule, Take 80 mg by mouth daily., Disp: , Rfl:    OLANZapine (ZYPREXA) 5 MG tablet, Take 5 mg by mouth at bedtime as needed., Disp: , Rfl:    QUEtiapine (SEROQUEL) 100 MG tablet, Take by mouth. (Patient not taking: Reported on 02/14/2022), Disp: , Rfl:    tiotropium (SPIRIVA) 18 MCG inhalation capsule, Place into inhaler and inhale. (Patient not taking: Reported on 02/14/2022), Disp: , Rfl:    traZODone (DESYREL) 50 MG tablet, Take 50 mg by mouth at bedtime as needed., Disp: , Rfl:    Subjective:   PATIENT ID: Paul Summers  D Delfina Redwood GENDER: male DOB: 01/16/1961, MRN: [id number]  Chief Complaint  Patient presents with   Follow-up    Lung Nodule. No SOB or wheezing. Cough with clear sputum.     HPI  Paul Summers is a 61 year old male presenting to clinic for follow up on a pulmonary nodule.   He only mild symptoms of shortness of breath (with exertion). He has no cough, no hemoptysis, and no sputum production. Symptoms are unchanged from prior. He reports smoking 2 packs a day for the  past 50 years, and has recently cut back to 1 pack a day. He has no interest in quitting smoking. He reports that he was institutionalized for psychiatric illness for 30 years. He is maintained on halopreidol, olanzapine, and valbenazine.   He tells me that his PCP had obtained a CT of the chest in April of 2023 that showed a pulmonary nodule. He was referred to pulmonary for an evaluation. I had ordered a repeat CT (showing no change in the nodule) and a PET/CT showing no FDG uptake in the nodule. The patient is back to discuss the results. He tells me that he has seen a lawyer and has filled an advanced directive stating he wants to be DNR/DNI. He tells me he would like a diagnosis but does not want to do anything about it and would refuse any surgery.  Ancillary information including prior medications, full medical/surgical/family/social histories, and PFTs (when available) are listed below and have been reviewed.   Review of Systems  Constitutional:  Positive for weight loss. Negative for chills and fever.  Respiratory:  Positive for cough and shortness of breath. Negative for hemoptysis and sputum production.   Cardiovascular:  Negative for chest pain and palpitations.     Objective:   Vitals:   03/07/22 1453  BP: 118/74  Pulse: 78  Temp: (!) 97.4 F (36.3 C)  SpO2: 96%  Weight: 207 lb 9.6 oz (94.2 kg)  Height: '5\' 10"'$  (1.778 m)   96% on RA  BMI Readings from Last 3 Encounters:  03/07/22 29.79 kg/m  02/14/22 29.70 kg/m  03/28/18 24.39 kg/m   Wt Readings from Last 3 Encounters:  03/07/22 207 lb 9.6 oz (94.2 kg)  02/14/22 207 lb (93.9 kg)  03/28/18 170 lb (77.1 kg)    Physical Exam Constitutional:      Appearance: He is obese.  HENT:     Head: Normocephalic and atraumatic.     Nose: Nose normal.     Mouth/Throat:     Mouth: Mucous membranes are moist.  Cardiovascular:     Rate and Rhythm: Normal rate and regular rhythm.     Pulses: Normal pulses.     Heart  sounds: Normal heart sounds.  Pulmonary:     Effort: Pulmonary effort is normal.     Breath sounds: Normal breath sounds.  Musculoskeletal:     Cervical back: Neck supple.  Lymphadenopathy:     Cervical: No cervical adenopathy.  Neurological:     General: No focal deficit present.     Mental Status: He is alert and oriented to person, place, and time. Mental status is at baseline.       Ancillary Information    Past Medical History:  Diagnosis Date   COPD (chronic obstructive pulmonary disease) (HCC)    GERD (gastroesophageal reflux disease)    Hepatitis    Hyperlipidemia    Schizophrenia ([place])      Family History  Problem Relation Age of  Onset   Lung cancer Mother    Pneumonia Maternal Grandmother      Past Surgical History:  Procedure Laterality Date   COLONOSCOPY     COLONOSCOPY WITH PROPOFOL N/A 09/25/2015   Procedure: COLONOSCOPY WITH PROPOFOL;  Surgeon: [name], MD;  Location[facility] ENDOSCOPY;  Service: Endoscopy;  Laterality: N/A;   COLONOSCOPY WITH PROPOFOL N/A 09/29/2015   Procedure: COLONOSCOPY WITH PROPOFOL;  Surgeon: [name], MD;  Location: [facility] ENDOSCOPY;  Service: Endoscopy;  Laterality: N/A;   FOREARM SURGERY     TOE SURGERY      Social History   Socioeconomic History   Marital status: Single    Spouse name: Not on file   Number of children: Not on file   Years of education: Not on file   Highest education level: Not on file  Occupational History   Not on file  Tobacco Use   Smoking status: Every Day    Packs/day: 2.00    Years: 50.00    Total pack years: 100.00    Types: Cigarettes   Smokeless tobacco: Never   Tobacco comments:    1PPD 03/07/2022  Substance and Sexual Activity   Alcohol use: Yes    Comment: 1-2 cans once weekly   Drug use: No   Sexual activity: Not on file  Other Topics Concern   Not on file  Social History Narrative   Not on file   Social Determinants of Health   Financial Resource Strain: Not  on file  Food Insecurity: Not on file  Transportation Needs: Not on file  Physical Activity: Not on file  Stress: Not on file  Social Connections: Not on file  Intimate Partner Violence: Not on file     Allergies  Allergen Reactions   Ether Anaphylaxis   Other Swelling   Sulfa Antibiotics      CBC    Component Value Date/Time   WBC 4.0 09/25/2015 1304   RBC 4.62 09/25/2015 1304   HGB 13.7 09/25/2015 1304   HCT 40.3 09/25/2015 1304   PLT 299 09/25/2015 1304   MCV 87.1 09/25/2015 1304   MCH 29.6 09/25/2015 1304   MCHC 33.9 09/25/2015 1304   RDW 14.3 09/25/2015 1304    Pulmonary Functions Testing Results:     No data to display          Outpatient Medications Prior to Visit  Medication Sig Dispense Refill   atorvastatin (LIPITOR) 40 MG tablet Take by mouth.     cholecalciferol (VITAMIN D) 1000 units tablet Take 1,000 Units by mouth daily.     haloperidol decanoate (HALDOL DECANOATE) 100 MG/ML injection Inject 100 mg into the muscle every 28 (twenty-eight) days.     LORazepam (ATIVAN) 1 MG tablet Take by mouth.     metFORMIN (GLUCOPHAGE) 500 MG tablet Take by mouth.     OLANZapine (ZYPREXA) 20 MG tablet Take 20 mg by mouth at bedtime.     albuterol (VENTOLIN HFA) 108 (90 Base) MCG/ACT inhaler Inhale into the lungs. (Patient not taking: Reported on 02/14/2022)     Cholecalciferol 25 MCG (1000 UT) capsule Take by mouth. (Patient not taking: Reported on 02/14/2022)     divalproex (DEPAKOTE) 500 MG DR tablet Take by mouth. (Patient not taking: Reported on 02/14/2022)     fenofibrate 160 MG tablet Take 160 mg by mouth daily. (Patient not taking: Reported on 03/07/2022)     Fish Oil-Cholecalciferol (OMEGA-3 FISH OIL/VITAMIN D3) 1000-1000 MG-UNIT CAPS Take by mouth. (  Patient not taking: Reported on 02/14/2022)     haloperidol lactate (HALDOL) 5 MG/ML injection 5 mg. (Patient not taking: Reported on 03/07/2022)     INGREZZA 80 MG capsule Take 80 mg by mouth daily.     OLANZapine  (ZYPREXA) 5 MG tablet Take 5 mg by mouth at bedtime as needed.     QUEtiapine (SEROQUEL) 100 MG tablet Take by mouth. (Patient not taking: Reported on 02/14/2022)     tiotropium (SPIRIVA) 18 MCG inhalation capsule Place into inhaler and inhale. (Patient not taking: Reported on 02/14/2022)     traZODone (DESYREL) 50 MG tablet Take 50 mg by mouth at bedtime as needed.     No facility-administered medications prior to visit.

## 2022-03-09 NOTE — Addendum Note (Signed)
Addended by: Armando Reichert on: 03/09/2022 09:23 AM   Modules accepted: Orders

## 2022-03-21 ENCOUNTER — Telehealth: Payer: Self-pay | Admitting: Student in an Organized Health Care Education/Training Program

## 2022-03-21 NOTE — Telephone Encounter (Signed)
Anita, can you help with this. Thanks 

## 2022-03-21 NOTE — Telephone Encounter (Signed)
I just spoke with Butch Penny and she didn't make good enough notes to remember our conversation about the  PET Scan. Dr. Genia Harold just wants which ever PET scan that will give him the results that he is looking for about cancer.  Butch Penny states they use copper and it is just as good

## 2022-04-01 ENCOUNTER — Telehealth: Payer: Self-pay | Admitting: Student in an Organized Health Care Education/Training Program

## 2022-04-01 NOTE — Telephone Encounter (Signed)
Spoke to [place] with [facility]. She stated that patient is scheduled for PET and order needs to be signed by MD.  Dr. Genia Harold, please sign PET scan order. Thanks

## 2022-04-05 NOTE — Telephone Encounter (Signed)
Noted  

## 2022-04-06 ENCOUNTER — Ambulatory Visit
Admission: RE | Admit: 2022-04-06 | Discharge: 2022-04-06 | Disposition: A | Discharge status: Home / Self Care | Payer: Medicare HMO | Source: Ambulatory Visit | Attending: Student in an Organized Health Care Education/Training Program | Admitting: Student in an Organized Health Care Education/Training Program

## 2022-04-06 DIAGNOSIS — R911 Solitary pulmonary nodule: Secondary | ICD-10-CM | POA: Diagnosis present

## 2022-04-06 MED ORDER — COPPER CU 64 DOTATATE 1 MCI/ML IV SOLN
4.0000 | Freq: Once | INTRAVENOUS | Status: AC
  Administered 2022-04-06: 4.07 via INTRAVENOUS

## 2022-04-27 ENCOUNTER — Ambulatory Visit (INDEPENDENT_AMBULATORY_CARE_PROVIDER_SITE_OTHER): Payer: Medicare HMO | Admitting: Student in an Organized Health Care Education/Training Program

## 2022-04-27 ENCOUNTER — Encounter: Payer: Self-pay | Admitting: Student in an Organized Health Care Education/Training Program

## 2022-04-27 VITALS — BP 120/70 | HR 82 | Temp 97.3°F | Ht 70.0 in | Wt 208.8 lb

## 2022-04-27 DIAGNOSIS — J449 Chronic obstructive pulmonary disease, unspecified: Secondary | ICD-10-CM | POA: Diagnosis not present

## 2022-04-27 DIAGNOSIS — R911 Solitary pulmonary nodule: Secondary | ICD-10-CM | POA: Diagnosis not present

## 2022-04-27 NOTE — Progress Notes (Signed)
Assessment & Plan:   1. Lung nodule  He presents to follow up on his RUL nodule that appears to be stable compared to his prior CT. The nodule is not FDG avid and Dotatate scan was negative. There is the possibility that this is an indolent malignancy vs. Other inflammatory or infectious nodule. I explained to Paul Summers that the only way to know is to obtain a biopsy, and he is amenable to that. His [facility] risk for malignancy returns at 3.2%.  We discussed the importance of diagnosis and staging in lung malignancies, and the approach to obtaining a tissue diagnosis which would include robotic assisted navigational bronchoscopy with endobronchial ultrasound guided sampling.  We also discussed the risks associated with the procedure which include a 2% risk of pneumothorax, infection, bleeding, and nondiagnostic procedure in detail.  I explained that patients typically are able to return home the same day of the procedure, but in rare cases admission to the hospital for observation and treatment is required.  After our discussion, the patient elected to proceed with the procedure  2. Chronic obstructive pulmonary disease, unspecified COPD type ([place])  He has a long standing history of smoking and reports exertional dyspnea. Suspect a component of COPD. He will require PFT's to assess for COPD as well as risk for surgery should he require a lobectomy. This has not been performed, unfortunately. Will try to reschedule him for PFT's again. Patient not interested in smoking cessation.   Return in about 3 months (around 07/27/2022).  I spent 30 minutes caring for this patient today, including preparing to see the patient, obtaining a medical history , reviewing a separately obtained history, performing a medically appropriate examination and/or evaluation, counseling and educating the patient/family/caregiver, referring and communicating with other health care professionals (not separately  reported), and documenting clinical information in the electronic health record  Armando Reichert, MD [name] 04/27/2022 2:24 PM    End of visit medications:  No orders of the defined types were placed in this encounter.    Current Outpatient Medications:    aspirin EC 81 MG tablet, Take 81 mg by mouth daily. Swallow whole., Disp: , Rfl:    atorvastatin (LIPITOR) 40 MG tablet, Take 40 mg by mouth daily., Disp: , Rfl:    cetirizine (ZYRTEC) 10 MG tablet, Take 10 mg by mouth daily., Disp: , Rfl:    cholecalciferol (VITAMIN D) 1000 units tablet, Take 2,000 Units by mouth daily., Disp: , Rfl:    docusate sodium (STOOL SOFTENER) 100 MG capsule, Take 100 mg by mouth 2 (two) times daily., Disp: , Rfl:    haloperidol decanoate (HALDOL DECANOATE) 100 MG/ML injection, Inject 100 mg into the muscle every 28 (twenty-eight) days., Disp: , Rfl:    INGREZZA 80 MG capsule, Take 80 mg by mouth daily., Disp: , Rfl:    LORazepam (ATIVAN) 1 MG tablet, Take by mouth., Disp: , Rfl:    metFORMIN (GLUCOPHAGE) 500 MG tablet, Take by mouth., Disp: , Rfl:    Multiple Vitamins-Minerals (SENTRY ADULT PO), Take 1 tablet by mouth daily., Disp: , Rfl:    OLANZapine (ZYPREXA) 20 MG tablet, Take 20 mg by mouth at bedtime., Disp: , Rfl:    traZODone (DESYREL) 50 MG tablet, Take 50 mg by mouth at bedtime as needed., Disp: , Rfl:    albuterol (VENTOLIN HFA) 108 (90 Base) MCG/ACT inhaler, Inhale into the lungs. (Patient not taking: Reported on 02/14/2022), Disp: , Rfl:  Cholecalciferol 25 MCG (1000 UT) capsule, Take by mouth. (Patient not taking: Reported on 02/14/2022), Disp: , Rfl:    divalproex (DEPAKOTE) 500 MG DR tablet, Take by mouth. (Patient not taking: Reported on 02/14/2022), Disp: , Rfl:    fenofibrate 160 MG tablet, Take 160 mg by mouth daily. (Patient not taking: Reported on 03/07/2022), Disp: , Rfl:    Fish Oil-Cholecalciferol (OMEGA-3 FISH OIL/VITAMIN D3) 1000-1000 MG-UNIT CAPS, Take by  mouth. (Patient not taking: Reported on 02/14/2022), Disp: , Rfl:    haloperidol lactate (HALDOL) 5 MG/ML injection, 5 mg. (Patient not taking: Reported on 03/07/2022), Disp: , Rfl:    OLANZapine (ZYPREXA) 5 MG tablet, Take 5 mg by mouth at bedtime as needed. (Patient not taking: Reported on 04/27/2022), Disp: , Rfl:    QUEtiapine (SEROQUEL) 100 MG tablet, Take by mouth. (Patient not taking: Reported on 02/14/2022), Disp: , Rfl:    tiotropium (SPIRIVA) 18 MCG inhalation capsule, Place into inhaler and inhale. (Patient not taking: Reported on 02/14/2022), Disp: , Rfl:    Subjective:   PATIENT ID: Paul Summers GENDER: male DOB: 12-05-1960, MRN: [id number]  Chief Complaint  Patient presents with   Follow-up    No SOB or wheezing. Dry cough.    HPI  Paul Summers is a 61 year old male presenting to clinic for follow up on a pulmonary nodule.   He only mild symptoms of shortness of breath (with exertion). He has no cough, no hemoptysis, and no sputum production. Symptoms are unchanged from prior. He reports smoking 2 packs a day for the past 50 years, and has cut back to 1 pack a day. He has no interest in quitting smoking. He reports that he was institutionalized for psychiatric illness for 30 years. He is maintained on halopreidol, olanzapine, and valbenazine. He lives alone.  He is here to follow up on his pulmonary nodule. His PCP obtained a chest CT in April of 2023 that showed said pulmonary nodule for which he was referred. I had ordered a repeat CT (showing no change in the nodule) and a PET/CT showing no FDG uptake in the nodule. A Dotatate scan was also negative and not suggestive of carcinoid. The patient is back to discuss the results. He also has his advanced directives with him that we discussed during our last visit. The directives don't mention DNR/DNI, only that he would not want his life extended if there's not hope of meaningful recovery. The papers will be scanned into the medical  record. He is now telling me that he is amenable for a biopsy but requests that we do it in January rather than in December.  Ancillary information including prior medications, full medical/surgical/family/social histories, and PFTs (when available) are listed below and have been reviewed.   Review of Systems  Constitutional:  Negative for chills, fever and weight loss.  Respiratory:  Positive for cough and shortness of breath. Negative for hemoptysis and sputum production.   Cardiovascular:  Negative for chest pain and palpitations.     Objective:   Vitals:   04/27/22 1402  BP: 120/70  Pulse: 82  Temp: (!) 97.3 F (36.3 C)  SpO2: 97%  Weight: 208 lb 12.8 oz (94.7 kg)  Height: '5\' 10"'$  (1.778 m)   97% on RA  BMI Readings from Last 3 Encounters:  04/27/22 29.96 kg/m  04/06/22 30.13 kg/m  03/07/22 29.79 kg/m   Wt Readings from Last 3 Encounters:  04/27/22 208 lb 12.8 oz (94.7 kg)  04/06/22 210  lb (95.3 kg)  03/07/22 207 lb 9.6 oz (94.2 kg)    Physical Exam Constitutional:      Appearance: He is obese.  HENT:     Head: Normocephalic and atraumatic.     Nose: Nose normal.     Mouth/Throat:     Mouth: Mucous membranes are moist.  Cardiovascular:     Rate and Rhythm: Normal rate and regular rhythm.     Pulses: Normal pulses.     Heart sounds: Normal heart sounds.  Pulmonary:     Effort: Pulmonary effort is normal.     Breath sounds: Normal breath sounds.  Musculoskeletal:     Cervical back: Neck supple.  Lymphadenopathy:     Cervical: No cervical adenopathy.  Neurological:     General: No focal deficit present.     Mental Status: He is alert and oriented to person, place, and time. Mental status is at baseline.       Ancillary Information    Past Medical History:  Diagnosis Date   COPD (chronic obstructive pulmonary disease) (HCC)    GERD (gastroesophageal reflux disease)    Hepatitis    Hyperlipidemia    Schizophrenia ([place])      Family History   Problem Relation Age of Onset   Lung cancer Mother    Pneumonia Maternal Grandmother      Past Surgical History:  Procedure Laterality Date   COLONOSCOPY     COLONOSCOPY WITH PROPOFOL N/A 09/25/2015   Procedure: COLONOSCOPY WITH PROPOFOL;  Surgeon: [name], MD;  Location: [facility] ENDOSCOPY;  Service: Endoscopy;  Laterality: N/A;   COLONOSCOPY WITH PROPOFOL N/A 09/29/2015   Procedure: COLONOSCOPY WITH PROPOFOL;  Surgeon: [name], MD;  Location: [facility] ENDOSCOPY;  Service: Endoscopy;  Laterality: N/A;   FOREARM SURGERY     TOE SURGERY      Social History   Socioeconomic History   Marital status: Single    Spouse name: Not on file   Number of children: Not on file   Years of education: Not on file   Highest education level: Not on file  Occupational History   Not on file  Tobacco Use   Smoking status: Every Day    Packs/day: 2.00    Years: 50.00    Total pack years: 100.00    Types: Cigarettes   Smokeless tobacco: Never   Tobacco comments:    1PPD 04/27/2022 khj  Substance and Sexual Activity   Alcohol use: Yes    Comment: 1-2 cans once weekly   Drug use: No   Sexual activity: Not on file  Other Topics Concern   Not on file  Social History Narrative   Not on file   Social Determinants of Health   Financial Resource Strain: Not on file  Food Insecurity: Not on file  Transportation Needs: Not on file  Physical Activity: Not on file  Stress: Not on file  Social Connections: Not on file  Intimate Partner Violence: Not on file     Allergies  Allergen Reactions   Ether Anaphylaxis   Other Swelling   Sulfa Antibiotics      CBC    Component Value Date/Time   WBC 4.0 09/25/2015 1304   RBC 4.62 09/25/2015 1304   HGB 13.7 09/25/2015 1304   HCT 40.3 09/25/2015 1304   PLT 299 09/25/2015 1304   MCV 87.1 09/25/2015 1304   MCH 29.6 09/25/2015 1304   MCHC 33.9 09/25/2015 1304   RDW 14.3 09/25/2015 1304  Pulmonary Functions Testing Results:      No data to display          Outpatient Medications Prior to Visit  Medication Sig Dispense Refill   aspirin EC 81 MG tablet Take 81 mg by mouth daily. Swallow whole.     atorvastatin (LIPITOR) 40 MG tablet Take 40 mg by mouth daily.     cetirizine (ZYRTEC) 10 MG tablet Take 10 mg by mouth daily.     cholecalciferol (VITAMIN D) 1000 units tablet Take 2,000 Units by mouth daily.     docusate sodium (STOOL SOFTENER) 100 MG capsule Take 100 mg by mouth 2 (two) times daily.     haloperidol decanoate (HALDOL DECANOATE) 100 MG/ML injection Inject 100 mg into the muscle every 28 (twenty-eight) days.     INGREZZA 80 MG capsule Take 80 mg by mouth daily.     LORazepam (ATIVAN) 1 MG tablet Take by mouth.     metFORMIN (GLUCOPHAGE) 500 MG tablet Take by mouth.     Multiple Vitamins-Minerals (SENTRY ADULT PO) Take 1 tablet by mouth daily.     OLANZapine (ZYPREXA) 20 MG tablet Take 20 mg by mouth at bedtime.     traZODone (DESYREL) 50 MG tablet Take 50 mg by mouth at bedtime as needed.     albuterol (VENTOLIN HFA) 108 (90 Base) MCG/ACT inhaler Inhale into the lungs. (Patient not taking: Reported on 02/14/2022)     Cholecalciferol 25 MCG (1000 UT) capsule Take by mouth. (Patient not taking: Reported on 02/14/2022)     divalproex (DEPAKOTE) 500 MG DR tablet Take by mouth. (Patient not taking: Reported on 02/14/2022)     fenofibrate 160 MG tablet Take 160 mg by mouth daily. (Patient not taking: Reported on 03/07/2022)     Fish Oil-Cholecalciferol (OMEGA-3 FISH OIL/VITAMIN D3) 1000-1000 MG-UNIT CAPS Take by mouth. (Patient not taking: Reported on 02/14/2022)     haloperidol lactate (HALDOL) 5 MG/ML injection 5 mg. (Patient not taking: Reported on 03/07/2022)     OLANZapine (ZYPREXA) 5 MG tablet Take 5 mg by mouth at bedtime as needed. (Patient not taking: Reported on 04/27/2022)     QUEtiapine (SEROQUEL) 100 MG tablet Take by mouth. (Patient not taking: Reported on 02/14/2022)     tiotropium (SPIRIVA) 18 MCG  inhalation capsule Place into inhaler and inhale. (Patient not taking: Reported on 02/14/2022)     No facility-administered medications prior to visit.

## 2022-04-29 ENCOUNTER — Encounter: Payer: Self-pay | Admitting: Student in an Organized Health Care Education/Training Program

## 2022-04-29 ENCOUNTER — Other Ambulatory Visit: Payer: Self-pay | Admitting: Student in an Organized Health Care Education/Training Program

## 2022-04-29 DIAGNOSIS — R911 Solitary pulmonary nodule: Secondary | ICD-10-CM

## 2022-05-11 ENCOUNTER — Telehealth: Payer: Self-pay | Admitting: Student in an Organized Health Care Education/Training Program

## 2022-05-11 NOTE — Telephone Encounter (Signed)
I called the patient back. He said they he was going to have to cancel his appointment due to transportation issues. He said he did get it worked out and now has transportation. He will be able to have his CT done on 05/31/2021.   Nothing further needed.

## 2022-05-31 ENCOUNTER — Ambulatory Visit
Admission: RE | Admit: 2022-05-31 | Discharge: 2022-05-31 | Disposition: A | Discharge status: Home / Self Care | Payer: Medicare HMO | Source: Ambulatory Visit | Attending: Student in an Organized Health Care Education/Training Program | Admitting: Student in an Organized Health Care Education/Training Program

## 2022-05-31 DIAGNOSIS — R911 Solitary pulmonary nodule: Secondary | ICD-10-CM

## 2022-06-01 ENCOUNTER — Other Ambulatory Visit: Payer: Self-pay

## 2022-06-01 ENCOUNTER — Encounter (HOSPITAL_COMMUNITY): Payer: Self-pay | Admitting: Student in an Organized Health Care Education/Training Program

## 2022-06-01 NOTE — Progress Notes (Signed)
PCP - Dr. Boykin Nearing  Cardiologist - Denies  EP- Denies  Endocrine- Denies  Pulm- Dr. Genia Harold  Chest x-ray - 06/02/22- Day of surgery  EKG - 06/02/22- Day of surgery  Stress Test - Denies  ECHO - Denies  Cardiac Cath - Denies  AICD-na PM-na LOOP-na  Nerve Stimulator- Denies  Dialysis- Denies  Sleep Study - Yes- Negative CPAP - Denies  LABS- 06/02/22: CBC, BMP, COVID  ASA- LD- 1/9  ERAS- No  HA1C- unknown Fasting Blood Sugar - N/A Checks Blood Sugar __0___ times a day- Pt states he does not have to check his levels at home.  Anesthesia- No  Pt denies the pt having chest pain, sob, or fever during the pre-op phone call. All instructions explained to the pt, with a verbal understanding of the material. Pt also instructed to wear a mask and social distance if he goes out. The opportunity to ask questions was provided.

## 2022-06-01 NOTE — Progress Notes (Signed)
S.D.W- Instructions   Your procedure is scheduled on Thurs., Jan. 11, 2024 from 7:30AM-9:00AM.  Report to [facility] Main Entrance "A" at 5:30 A.M., then check in with the Admitting office.  Call this number if you have problems the morning of surgery:  [phone]             If you experience any cold or flu symptoms such as cough, fever, chills, shortness of breath, etc. between now and your scheduled surgery, please notify us at the above         number.  Masks are now required throughout our facilities due to the increasing cases of Covid, Flu, and RSV infections.   Remember:  Do not eat or drink after midnight on Jan. 10th    Take these medicines the morning of surgery with A SIP OF WATER: None  As of today, STOP taking any Aspirin (unless otherwise instructed by your surgeon) Aleve, Naproxen, Ibuprofen, Motrin, Advil, Goody's, BC's, all herbal medications, fish oil, and all vitamins.  WHAT DO I DO ABOUT MY DIABETES MEDICATION?  Do not take MetFORMIN (GLUCOPHAGE) the morning of surgery.  Reviewed and Endorsed by [facility] Patient Education Committee, August 2015          Do not wear jewelry. Do not wear lotions, powders, cologne or deodorant. Do not shave 48 hours prior to surgery.  Men may shave face and neck. Do not bring valuables to the hospital.  [facility] is not responsible for any belongings or valuables.    Do NOT Smoke (Tobacco/Vaping)  24 hours prior to your procedure  If you use a CPAP at night, you may bring your mask for your overnight stay.   Contacts, glasses, hearing aids, dentures or partials may not be worn into surgery, please bring cases for these belongings   For patients admitted to the hospital, discharge time will be determined by your treatment team.   Patients discharged the day of surgery will not be allowed to drive home, and someone needs to stay with them for 24 hours.  Special instructions:    Oral Hygiene is also important to  reduce your risk of infection.  Remember - BRUSH YOUR TEETH THE MORNING OF SURGERY WITH YOUR REGULAR TOOTHPASTE  [facility]- Preparing For Surgery  Before surgery, you can play an important role. Because skin is not sterile, your skin needs to be as free of germs as possible. You can reduce the number of germs on your skin by washing with Antibacterial Soap before surgery.     Please follow these instructions carefully.     Shower the NIGHT BEFORE SURGERY and the MORNING OF SURGERY with Antibacterial Soap.   Pat yourself dry with a CLEAN TOWEL.  Wear CLEAN PAJAMAS to bed the night before surgery  Place CLEAN SHEETS on your bed the night before your surgery  DO NOT SLEEP WITH PETS.  Day of Surgery:  Take a shower with Antibacterial soap. Wear Clean/Comfortable clothing the morning of surgery Do not apply any deodorants/lotions.   Remember to brush your teeth WITH YOUR REGULAR TOOTHPASTE.   If you test positive for Covid, or been in contact with anyone that has tested positive in the last 10 days, please notify your surgeon.  SURGICAL WAITING ROOM VISITATION Patients having surgery or a procedure may have no more than 2 support people in the waiting area - these visitors may rotate.   Children under the age of 74 must have an adult with them  who is not the patient. If the patient needs to stay at the hospital during part of their recovery, the visitor guidelines for inpatient rooms apply. Pre-op nurse will coordinate an appropriate time for 1 support person to accompany patient in pre-op.  This support person may not rotate.   Please refer to the [facility] website for the visitor guidelines for Inpatients (after your surgery is over and you are in a regular room).

## 2022-06-02 ENCOUNTER — Ambulatory Visit (HOSPITAL_COMMUNITY): Payer: Medicare HMO

## 2022-06-02 ENCOUNTER — Ambulatory Visit (HOSPITAL_BASED_OUTPATIENT_CLINIC_OR_DEPARTMENT_OTHER): Payer: Medicare HMO | Admitting: Certified Registered"

## 2022-06-02 ENCOUNTER — Encounter (HOSPITAL_COMMUNITY): Payer: Self-pay | Admitting: Student in an Organized Health Care Education/Training Program

## 2022-06-02 ENCOUNTER — Ambulatory Visit (HOSPITAL_COMMUNITY)
Admission: RE | Admit: 2022-06-02 | Discharge: 2022-06-02 | Disposition: A | Discharge status: Home / Self Care | Payer: Medicare HMO | Attending: Student in an Organized Health Care Education/Training Program | Admitting: Student in an Organized Health Care Education/Training Program

## 2022-06-02 ENCOUNTER — Ambulatory Visit (HOSPITAL_COMMUNITY): Payer: Medicare HMO | Admitting: Certified Registered"

## 2022-06-02 ENCOUNTER — Encounter (HOSPITAL_COMMUNITY)
Admission: RE | Disposition: A | Discharge status: Home / Self Care | Payer: Self-pay | Source: Home / Self Care | Attending: Student in an Organized Health Care Education/Training Program

## 2022-06-02 DIAGNOSIS — F209 Schizophrenia, unspecified: Secondary | ICD-10-CM | POA: Insufficient documentation

## 2022-06-02 DIAGNOSIS — J449 Chronic obstructive pulmonary disease, unspecified: Secondary | ICD-10-CM | POA: Insufficient documentation

## 2022-06-02 DIAGNOSIS — Z7984 Long term (current) use of oral hypoglycemic drugs: Secondary | ICD-10-CM

## 2022-06-02 DIAGNOSIS — F17210 Nicotine dependence, cigarettes, uncomplicated: Secondary | ICD-10-CM | POA: Diagnosis not present

## 2022-06-02 DIAGNOSIS — R911 Solitary pulmonary nodule: Secondary | ICD-10-CM

## 2022-06-02 DIAGNOSIS — Z1152 Encounter for screening for COVID-19: Secondary | ICD-10-CM | POA: Insufficient documentation

## 2022-06-02 DIAGNOSIS — K219 Gastro-esophageal reflux disease without esophagitis: Secondary | ICD-10-CM | POA: Diagnosis not present

## 2022-06-02 DIAGNOSIS — E119 Type 2 diabetes mellitus without complications: Secondary | ICD-10-CM | POA: Insufficient documentation

## 2022-06-02 HISTORY — PX: BRONCHIAL NEEDLE ASPIRATION BIOPSY: SHX5106

## 2022-06-02 HISTORY — PX: VIDEO BRONCHOSCOPY WITH RADIAL ENDOBRONCHIAL ULTRASOUND: SHX6849

## 2022-06-02 HISTORY — PX: BRONCHIAL BRUSHINGS: SHX5108

## 2022-06-02 HISTORY — DX: Type 2 diabetes mellitus without complications: E11.9

## 2022-06-02 HISTORY — PX: BRONCHIAL BIOPSY: SHX5109

## 2022-06-02 LAB — CBC
HCT: 43.9 % (ref 39.0–52.0)
Hemoglobin: 14.6 g/dL (ref 13.0–17.0)
MCH: 28.9 pg (ref 26.0–34.0)
MCHC: 33.3 g/dL (ref 30.0–36.0)
MCV: 86.8 fL (ref 80.0–100.0)
Platelets: 282 10*3/uL (ref 150–400)
RBC: 5.06 MIL/uL (ref 4.22–5.81)
RDW: 13.3 % (ref 11.5–15.5)
WBC: 6.6 10*3/uL (ref 4.0–10.5)
nRBC: 0 % (ref 0.0–0.2)

## 2022-06-02 LAB — GLUCOSE, CAPILLARY
Glucose-Capillary: 107 mg/dL — ABNORMAL HIGH (ref 70–99)
Glucose-Capillary: 129 mg/dL — ABNORMAL HIGH (ref 70–99)

## 2022-06-02 LAB — COMPREHENSIVE METABOLIC PANEL
ALT: 54 U/L — ABNORMAL HIGH (ref 0–44)
AST: 35 U/L (ref 15–41)
Albumin: 3.7 g/dL (ref 3.5–5.0)
Alkaline Phosphatase: 85 U/L (ref 38–126)
Anion gap: 7 (ref 5–15)
BUN: 12 mg/dL (ref 8–23)
CO2: 24 mmol/L (ref 22–32)
Calcium: 8.7 mg/dL — ABNORMAL LOW (ref 8.9–10.3)
Chloride: 105 mmol/L (ref 98–111)
Creatinine, Ser: 0.98 mg/dL (ref 0.61–1.24)
GFR, Estimated: >60 mL/min (ref >60)
Glucose, Bld: 106 mg/dL — ABNORMAL HIGH (ref 70–99)
Potassium: 4.3 mmol/L (ref 3.5–5.1)
Sodium: 136 mmol/L (ref 135–145)
Total Bilirubin: 0.6 mg/dL (ref 0.3–1.2)
Total Protein: 6.4 g/dL — ABNORMAL LOW (ref 6.5–8.1)

## 2022-06-02 LAB — NO BLOOD PRODUCTS

## 2022-06-02 LAB — SARS CORONAVIRUS 2 BY RT PCR: SARS Coronavirus 2 by RT PCR: NEGATIVE

## 2022-06-02 SURGERY — BRONCHOSCOPY, WITH BIOPSY USING ELECTROMAGNETIC NAVIGATION
Anesthesia: General | Laterality: Bilateral

## 2022-06-02 MED ORDER — LIDOCAINE HCL 2 % IJ SOLN
INTRAMUSCULAR | Status: DC | PRN
  Administered 2022-06-02: 5 mL

## 2022-06-02 MED ORDER — ROCURONIUM BROMIDE 10 MG/ML (PF) SYRINGE
PREFILLED_SYRINGE | INTRAVENOUS | Status: DC | PRN
  Administered 2022-06-02: 10 mg via INTRAVENOUS
  Administered 2022-06-02: 50 mg via INTRAVENOUS

## 2022-06-02 MED ORDER — ONDANSETRON HCL 4 MG/2ML IJ SOLN
INTRAMUSCULAR | Status: DC | PRN
  Administered 2022-06-02: 4 mg via INTRAVENOUS

## 2022-06-02 MED ORDER — SUGAMMADEX SODIUM 200 MG/2ML IV SOLN
INTRAVENOUS | Status: DC | PRN
  Administered 2022-06-02: 200 mg via INTRAVENOUS

## 2022-06-02 MED ORDER — CHLORHEXIDINE GLUCONATE 0.12 % MT SOLN
OROMUCOSAL | Status: AC
  Administered 2022-06-02: 15 mL via OROMUCOSAL
  Filled 2022-06-02: qty 15

## 2022-06-02 MED ORDER — LACTATED RINGERS IV SOLN: INTRAVENOUS | Status: DC

## 2022-06-02 MED ORDER — PROPOFOL 500 MG/50ML IV EMUL
INTRAVENOUS | Status: DC | PRN
  Administered 2022-06-02 (×2): 150 ug/kg/min via INTRAVENOUS

## 2022-06-02 MED ORDER — PROPOFOL 10 MG/ML IV BOLUS
INTRAVENOUS | Status: DC | PRN
  Administered 2022-06-02 (×2): 20 mg via INTRAVENOUS
  Administered 2022-06-02: 180 mg via INTRAVENOUS

## 2022-06-02 MED ORDER — LIDOCAINE 2% (20 MG/ML) 5 ML SYRINGE
INTRAMUSCULAR | Status: DC | PRN
  Administered 2022-06-02: 40 mg via INTRAVENOUS

## 2022-06-02 MED ORDER — PHENYLEPHRINE HCL-NACL 20-0.9 MG/250ML-% IV SOLN
INTRAVENOUS | Status: DC | PRN
  Administered 2022-06-02: 10 ug/min via INTRAVENOUS

## 2022-06-02 MED ORDER — CHLORHEXIDINE GLUCONATE 0.12 % MT SOLN: 15.0000 mL | OROMUCOSAL | Status: AC

## 2022-06-02 MED ORDER — DEXAMETHASONE SODIUM PHOSPHATE 10 MG/ML IJ SOLN
INTRAMUSCULAR | Status: DC | PRN
  Administered 2022-06-02: 4 mg via INTRAVENOUS

## 2022-06-02 MED ORDER — IPRATROPIUM-ALBUTEROL 0.5-2.5 (3) MG/3ML IN SOLN
3.0000 mL | Freq: Once | RESPIRATORY_TRACT | Status: AC
  Administered 2022-06-02: 3 mL via RESPIRATORY_TRACT

## 2022-06-02 MED ORDER — MIDAZOLAM HCL 5 MG/5ML IJ SOLN
INTRAMUSCULAR | Status: DC | PRN
  Administered 2022-06-02: 2 mg via INTRAVENOUS

## 2022-06-02 NOTE — Anesthesia Procedure Notes (Signed)
Procedure Name: Intubation Date/Time: 06/02/2022 7:33 AM  Performed by: Wilburn Cornelia, CRNAPre-anesthesia Checklist: Patient identified, Emergency Drugs available, Suction available, Patient being monitored and Timeout performed Patient Re-evaluated:Patient Re-evaluated prior to induction Oxygen Delivery Method: Circle system utilized Preoxygenation: Pre-oxygenation with 100% oxygen Induction Type: IV induction Ventilation: Mask ventilation without difficulty Laryngoscope Size: Mac and 4 Grade View: Grade II Tube type: Oral Tube size: 8.5 mm Number of attempts: 1 Airway Equipment and Method: Stylet Placement Confirmation: ETT inserted through vocal cords under direct vision, positive ETCO2, CO2 detector and breath sounds checked- equal and bilateral Secured at: 23 cm Tube secured with: Tape Dental Injury: Teeth and Oropharynx as per pre-operative assessment

## 2022-06-02 NOTE — Transfer of Care (Signed)
Immediate Anesthesia Transfer of Care Note  Patient: KELTIN BAIRD  Procedure(s) Performed: ROBOTIC ASSISTED NAVIGATIONAL BRONCHOSCOPY (Bilateral) BRONCHIAL BIOPSIES BRONCHIAL NEEDLE ASPIRATION BIOPSIES VIDEO BRONCHOSCOPY WITH RADIAL ENDOBRONCHIAL ULTRASOUND BRONCHIAL BRUSHINGS  Patient Location: PACU  Anesthesia Type:General  Level of Consciousness: awake, alert , and oriented  Airway & Oxygen Therapy: Patient Spontanous Breathing and Patient connected to nasal cannula oxygen  Post-op Assessment: Report given to RN and Post -op Vital signs reviewed and stable  Post vital signs: Reviewed and stable  Last Vitals:  Vitals Value Taken Time  BP 137/85   Temp    Pulse 96 06/02/22 0855  Resp 11 06/02/22 0855  SpO2 92 % 06/02/22 0855  Vitals shown include unvalidated device data.  Last Pain:  Vitals:   06/02/22 0630  TempSrc:   PainSc: 0-No pain      Patients Stated Pain Goal: 3 (16/10/96 0454)  Complications: No notable events documented.

## 2022-06-02 NOTE — Anesthesia Preprocedure Evaluation (Addendum)
Anesthesia Evaluation  Patient identified by MRN, date of birth, ID band Patient awake    Reviewed: Allergy & Precautions, NPO status , Patient's Chart, lab work & pertinent test results  Airway Mallampati: II  TM Distance: >3 FB Neck ROM: Full    Dental  (+) Edentulous Upper, Edentulous Lower   Pulmonary COPD, Current Smoker    + decreased breath sounds      Cardiovascular negative cardio ROS  Rhythm:Regular Rate:Normal     Neuro/Psych  PSYCHIATRIC DISORDERS      negative neurological ROS     GI/Hepatic ,GERD  ,,(+) Hepatitis -  Endo/Other  diabetes, Type 2, Oral Hypoglycemic Agents    Renal/GU negative Renal ROS     Musculoskeletal negative musculoskeletal ROS (+)    Abdominal   Peds  Hematology negative hematology ROS (+)   Anesthesia Other Findings   Reproductive/Obstetrics                             Anesthesia Physical Anesthesia Plan  ASA: 3  Anesthesia Plan: General   Post-op Pain Management: Tylenol PO (pre-op)*   Induction: Intravenous  PONV Risk Score and Plan: 2 and Ondansetron, Dexamethasone and Midazolam  Airway Management Planned: Oral ETT  Additional Equipment: None  Intra-op Plan:   Post-operative Plan: Extubation in OR  Informed Consent: I have reviewed the patients History and Physical, chart, labs and discussed the procedure including the risks, benefits and alternatives for the proposed anesthesia with the patient or authorized representative who has indicated his/her understanding and acceptance.     Dental advisory given  Plan Discussed with: CRNA  Anesthesia Plan Comments:         Anesthesia Quick Evaluation

## 2022-06-02 NOTE — H&P (Signed)
NAME:  Paul Summers, MRN:  [id number], DOB:  [birth year], LOS: 0 ADMISSION DATE:  06/02/2022, CHIEF COMPLAINT:  Lung Nodule   History of Present Illness:  Mr. Stille is a 62 year old male presenting for the evaluation and biopsy of his pulmonary nodule.   He only mild symptoms of shortness of breath (with exertion). He has no cough, no hemoptysis, and no sputum production. Symptoms are unchanged from prior. He reports smoking 2 packs a day for the past 50 years, and has cut back to 1 pack a day. He has no interest in quitting smoking. He reports that he was institutionalized for psychiatric illness for 30 years. He is maintained on halopreidol, olanzapine, and valbenazine. He lives alone.   He is here to follow up on his pulmonary nodule. His PCP obtained a chest CT in April of 2023 that showed said pulmonary nodule for which he was referred. I had ordered a repeat CT (showing no change in the nodule) and a PET/CT showing no FDG uptake in the nodule. A Dotatate scan was also negative and not suggestive of carcinoid. Pre-procedural CT scan for planning purposes shows stable nodule.  Objective   Blood pressure 118/78, pulse 71, temperature 98.9 F (37.2 C), temperature source Oral, resp. rate 17, height '5\' 10"'$  (1.778 m), weight 97.5 kg, SpO2 96 %.       No intake or output data in the 24 hours ending 06/02/22 0715 Filed Weights   06/01/22 1340 06/02/22 0615  Weight: 97.5 kg 97.5 kg    Examination: Physical Exam Constitutional:      Appearance: He is obese.  HENT:     Head: Normocephalic.     Mouth/Throat:     Mouth: Mucous membranes are moist.     Comments: adentulous  Cardiovascular:     Rate and Rhythm: Normal rate and regular rhythm.     Pulses: Normal pulses.     Heart sounds: Normal heart sounds.  Pulmonary:     Effort: Pulmonary effort is normal.     Breath sounds: Normal breath sounds.  Abdominal:     General: There is distension.     Palpations: Abdomen is soft.   Skin:    General: Skin is warm.  Neurological:     General: No focal deficit present.     Mental Status: He is alert. Mental status is at baseline.     Assessment & Plan:   #Pulmonary Nodule:  Patient is here for the evaluation of a RUL pulmonary nodule, he is at risk for malignancy given his long standing history of cigarette smoking.  We discussed the importance of diagnosis and staging in lung malignancies, and the approach to obtaining a tissue diagnosis which would include robotic assisted navigational bronchoscopy.  We also discussed the risks associated with the procedure which include a 2% risk of pneumothorax, infection, bleeding, and nondiagnostic procedure in detail.  I explained that patients typically are able to return home the same day of the procedure, but in rare cases admission to the hospital for observation and treatment is required.  Patient is appropriate for the procedure. Will proceed with robotic assisted navigational bronchoscopy.   Labs   CBC: Recent Labs  Lab 06/02/22 0624  WBC 6.6  HGB 14.6  HCT 43.9  MCV 86.8  PLT 740    Basic Metabolic Panel: No results for input(s): "NA", "K", "CL", "CO2", "GLUCOSE", "BUN", "CREATININE", "CALCIUM", "MG", "PHOS" in the last 168 hours. GFR: CrCl cannot be calculated (  No successful lab value found.). Recent Labs  Lab 06/02/22 0624  WBC 6.6    Liver Function Tests: No results for input(s): "AST", "ALT", "ALKPHOS", "BILITOT", "PROT", "ALBUMIN" in the last 168 hours. No results for input(s): "LIPASE", "AMYLASE" in the last 168 hours. No results for input(s): "AMMONIA" in the last 168 hours.  ABG No results found for: "PHART", "PCO2ART", "PO2ART", "HCO3", "TCO2", "ACIDBASEDEF", "O2SAT"   Coagulation Profile: No results for input(s): "INR", "PROTIME" in the last 168 hours.  Cardiac Enzymes: No results for input(s): "CKTOTAL", "CKMB", "CKMBINDEX", "TROPONINI" in the last 168 hours.  HbA1C: No results  found for: "HGBA1C"  CBG: Recent Labs  Lab 06/02/22 0617  GLUCAP 107*    Past Medical History:  He,  has a past medical history of COPD (chronic obstructive pulmonary disease) ([place]), Diabetes mellitus without complication ([place]), GERD (gastroesophageal reflux disease), Hepatitis, Hyperlipidemia, and Schizophrenia ([place]).   Surgical History:   Past Surgical History:  Procedure Laterality Date   COLONOSCOPY     COLONOSCOPY WITH PROPOFOL N/A 09/25/2015   Procedure: COLONOSCOPY WITH PROPOFOL;  Surgeon: [name], MD;  Location: [facility] ENDOSCOPY;  Service: Endoscopy;  Laterality: N/A;   COLONOSCOPY WITH PROPOFOL N/A 09/29/2015   Procedure: COLONOSCOPY WITH PROPOFOL;  Surgeon: [name], MD;  Location[facility] ENDOSCOPY;  Service: Endoscopy;  Laterality: N/A;   FOREARM SURGERY     TOE SURGERY     WRIST SURGERY Left      Social History:   reports that he has been smoking cigarettes. He has a 100.00 pack-year smoking history. He has never used smokeless tobacco. He reports current alcohol use of about 2.0 standard drinks of alcohol per week. He reports that he does not use drugs.   Family History:  His family history includes Lung cancer in his mother; Pneumonia in his maternal grandmother.   Allergies Allergies  Allergen Reactions   Ether Anaphylaxis   Sulfa Antibiotics Swelling and Rash     Home Medications  Prior to Admission medications   Medication Sig Start Date End Date Taking? Authorizing Provider  aspirin EC 81 MG tablet Take 81 mg by mouth daily. Swallow whole.   Yes [provider]  atorvastatin (LIPITOR) 40 MG tablet Take 40 mg by mouth at bedtime. 06/29/21  Yes [provider]  b complex vitamins capsule Take 1 capsule by mouth daily.   Yes [provider]  cetirizine (ZYRTEC) 10 MG tablet Take 10 mg by mouth daily.   Yes [provider]  Cholecalciferol (VITAMIN D) 50 MCG (2000 UT) tablet Take 4,000 Units by mouth daily.    Yes [provider]  docusate sodium (STOOL SOFTENER) 100 MG capsule Take 100 mg by mouth every morning.   Yes [provider]  haloperidol decanoate (HALDOL DECANOATE) 100 MG/ML injection Inject 100 mg into the muscle every 28 (twenty-eight) days.   Yes [provider]  INGREZZA 80 MG capsule Take 80 mg by mouth at bedtime. 02/18/22  Yes [provider]  metFORMIN (GLUCOPHAGE) 500 MG tablet Take 500 mg by mouth 2 (two) times daily with a meal. 06/25/21  Yes [provider]  Multiple Vitamins-Minerals (SENTRY ADULT PO) Take 1 tablet by mouth daily. Centrum Senior   Yes [provider]  OLANZapine (ZYPREXA) 20 MG tablet Take 20 mg by mouth at bedtime.   Yes [provider]  Fish Oil-Cholecalciferol (OMEGA-3 FISH OIL/VITAMIN D3) 1000-1000 MG-UNIT CAPS Take by mouth.    [provider]  Potassium  Gluconate 595 MG CAPS Take 595 mg by mouth daily.    [provider]  traZODone (DESYREL) 50 MG tablet Take 50 mg by mouth at bedtime as needed for sleep. 11/12/21   [provider]   Armando Reichert, MD [facility] 06/02/2022 7:19 AM

## 2022-06-02 NOTE — Op Note (Signed)
Video Bronchoscopy with Robotic Assisted Bronchoscopic Navigation   Date of Operation: 06/02/2022   Pre-op Diagnosis: RUL nodule  Post-op Diagnosis: RUL nodule  Surgeon: Armando Reichert, MD  Assistants: Baltazar Apo, MD  Anesthesia: General endotracheal anesthesia  Operation: Flexible video fiberoptic bronchoscopy with robotic assistance and biopsies.  Estimated Blood Loss: Minimal  Complications: None  Indications and History: Paul Summers is a 62 y.o. male with history of schizophrenia and COPD who presents for robotic assisted navigational bronchoscopy for the evaluation of a RUL nodule. The risks, benefits, complications, treatment options and expected outcomes were discussed with the patient.  The possibilities of pneumothorax, pneumonia, reaction to medication, pulmonary aspiration, perforation of a viscus, bleeding, failure to diagnose a condition and creating a complication requiring transfusion or operation were discussed with the patient who freely signed the consent.    Description of Procedure: The patient was seen in the Preoperative Area, was examined and was deemed appropriate to proceed.  The patient was taken to [facility] endoscopy room, identified as Paul Summers and the procedure verified as Flexible Video Fiberoptic Bronchoscopy.  A Time Out was held and the above information confirmed.   Prior to the date of the procedure a high-resolution CT scan of the chest was performed. Utilizing ION software program a virtual tracheobronchial tree was generated to allow the creation of distinct navigation pathways to the patient's parenchymal abnormalities. After being taken to the operating room general anesthesia was initiated and the patient  was orally intubated. The video fiberoptic bronchoscope was introduced via the endotracheal tube and a general inspection was performed which showed normal right and left lung anatomy, aspiration of the bilateral mainstems was completed to remove  any remaining secretions. Robotic catheter inserted into patient's endotracheal tube.   Target #1 RUL:  The distinct navigation pathways prepared prior to this procedure were then utilized to navigate to patient's lesion identified on CT scan. The robotic catheter was secured into place and the vision probe was withdrawn. Lesion location was approximated using fluoroscopy. We also performed a spin with the CiOS integrate cone beam CT and updated the location of the nodule with respect to the virtual tracheobronchial tree. Under fluoroscopic guidance transbronchial brushings, transbronchial needle biopsies, and transbronchial forceps biopsies were performed to be sent for cytology and pathology. We them performed another spin of the CiOS cone beam CT, and found mild divergance between the catheter and the target. We again adjusted the location of the lesion with respect to the tracheobronchial tree and obtained more samples using brushes, needle biopsies, and transbronchial forceps biopsies.  At the end of the procedure a general airway inspection was performed and there was no evidence of active bleeding. 5 cc of 1% lidocaine was instilled in the airway. The airway was subsequently cleared of all secretions. The bronchoscope was removed.  The patient tolerated the procedure well. There was no significant blood loss and there were no obvious complications. A post-procedural chest x-ray is pending.  Samples Target #1: 1. Transbronchial brushings from RUL nodule 2. Transbronchial needle biopsies from RUL nodule 3. Transbronchial forceps biopsies from RUL nodule  Plans:  The patient will be discharged from the PACU to home when recovered from anesthesia and after chest x-ray is reviewed. We will review the cytology, pathology and microbiology results with the patient when they become available. Outpatient followup will be with Armando Reichert, MD.  Armando Reichert, MD [facility] 06/02/2022 8:45 AM

## 2022-06-02 NOTE — Anesthesia Postprocedure Evaluation (Signed)
Anesthesia Post Note  Patient: Paul Summers  Procedure(s) Performed: ROBOTIC ASSISTED NAVIGATIONAL BRONCHOSCOPY (Bilateral) BRONCHIAL BIOPSIES BRONCHIAL NEEDLE ASPIRATION BIOPSIES VIDEO BRONCHOSCOPY WITH RADIAL ENDOBRONCHIAL ULTRASOUND BRONCHIAL BRUSHINGS     Patient location during evaluation: PACU Anesthesia Type: General Level of consciousness: awake and alert Pain management: pain level controlled Vital Signs Assessment: post-procedure vital signs reviewed and stable Respiratory status: spontaneous breathing, nonlabored ventilation, respiratory function stable and patient connected to nasal cannula oxygen Cardiovascular status: blood pressure returned to baseline and stable Postop Assessment: no apparent nausea or vomiting Anesthetic complications: no  No notable events documented.  Last Vitals:  Vitals:   06/02/22 0915 06/02/22 0930  BP: 121/74 119/71  Pulse: 96 94  Resp: 14 20  Temp:  36.4 C  SpO2: 96% 93%    Last Pain:  Vitals:   06/02/22 0930  TempSrc:   PainSc: 0-No pain                 Effie Berkshire

## 2022-06-05 ENCOUNTER — Encounter (HOSPITAL_COMMUNITY): Payer: Self-pay | Admitting: Student in an Organized Health Care Education/Training Program

## 2022-06-06 LAB — CYTOLOGY - NON PAP

## 2022-06-29 ENCOUNTER — Ambulatory Visit (INDEPENDENT_AMBULATORY_CARE_PROVIDER_SITE_OTHER): Payer: Medicare HMO | Admitting: Student in an Organized Health Care Education/Training Program

## 2022-06-29 ENCOUNTER — Encounter: Payer: Self-pay | Admitting: Student in an Organized Health Care Education/Training Program

## 2022-06-29 VITALS — BP 120/74 | HR 71 | Temp 98.2°F | Ht 70.0 in | Wt 215.2 lb

## 2022-06-29 DIAGNOSIS — R911 Solitary pulmonary nodule: Secondary | ICD-10-CM

## 2022-06-29 DIAGNOSIS — J449 Chronic obstructive pulmonary disease, unspecified: Secondary | ICD-10-CM | POA: Diagnosis not present

## 2022-06-29 NOTE — Progress Notes (Signed)
Synopsis: Follow up on pulmonary nodule  Assessment & Plan:   1. Lung nodule  He presents to follow up on his RUL nodule that appears to be stable compared to his prior CT. The nodule is not FDG avid and Dotatate scan was negative. There was the possibility that this is an indolent malignancy vs other inflammatory or infectious nodule. Patient underwent robotic assisted navigational bronchoscopy to the RUL nodule and cytology showed atypical cells with squamoid morphology. I did discuss this at length with Paul Summers and recommended radiation therapy. He would not want any therapy to this nodule (refused surgery, radiation, and chemotherapy). We did discuss the utility of having surveillance imaging to the nodule. Paul Summers would not be interested in any repeat procedure or therapy should the repeat imaging show the nodule to grow. After our discussion, the patient elected to not proceed with surveillance imaging.  2. Chronic obstructive pulmonary disease, unspecified COPD type ([place])  He has a long standing history of smoking and reports exertional dyspnea. Suspect a component of COPD. He will require PFT's to assess for COPD as well as risk for surgery should he require a lobectomy. This has not been performed, unfortunately. Will try to reschedule him for PFT's again. Patient not interested in smoking cessation.    Return in about 1 year (around 06/30/2023).  I spent 25 minutes caring for this patient today, including preparing to see the patient, obtaining a medical history , reviewing a separately obtained history, performing a medically appropriate examination and/or evaluation, counseling and educating the patient/family/caregiver, and documenting clinical information in the electronic health record  Armando Reichert, MD [facility] 06/29/2022 12:03 PM    End of visit medications:  No orders of the defined types were placed in this encounter.    Current Outpatient  Medications:    aspirin EC 81 MG tablet, Take 81 mg by mouth daily. Swallow whole., Disp: , Rfl:    atorvastatin (LIPITOR) 40 MG tablet, Take 40 mg by mouth at bedtime., Disp: , Rfl:    b complex vitamins capsule, Take 1 capsule by mouth daily., Disp: , Rfl:    cetirizine (ZYRTEC) 10 MG tablet, Take 10 mg by mouth daily., Disp: , Rfl:    Cholecalciferol (VITAMIN D) 50 MCG (2000 UT) tablet, Take 4,000 Units by mouth daily., Disp: , Rfl:    docusate sodium (STOOL SOFTENER) 100 MG capsule, Take 100 mg by mouth every morning., Disp: , Rfl:    Fish Oil-Cholecalciferol (OMEGA-3 FISH OIL/VITAMIN D3) 1000-1000 MG-UNIT CAPS, Take by mouth., Disp: , Rfl:    haloperidol decanoate (HALDOL DECANOATE) 100 MG/ML injection, Inject 100 mg into the muscle every 28 (twenty-eight) days., Disp: , Rfl:    INGREZZA 80 MG capsule, Take 80 mg by mouth at bedtime., Disp: , Rfl:    metFORMIN (GLUCOPHAGE) 500 MG tablet, Take 500 mg by mouth 2 (two) times daily with a meal., Disp: , Rfl:    Multiple Vitamins-Minerals (SENTRY ADULT PO), Take 1 tablet by mouth daily. Centrum Senior, Disp: , Rfl:    OLANZapine (ZYPREXA) 20 MG tablet, Take 20 mg by mouth at bedtime., Disp: , Rfl:    Potassium Gluconate 595 MG CAPS, Take 595 mg by mouth daily., Disp: , Rfl:    traZODone (DESYREL) 50 MG tablet, Take 50 mg by mouth at bedtime as needed for sleep., Disp: , Rfl:    Subjective:   PATIENT ID: Paul Summers GENDER: male DOB: 05-21-1961, MRN: [id number]  Chief  Complaint  Patient presents with   Follow-up    Bronch 06/02/2022- SOB, wheezing and prod cough with clear sputum    HPI  Paul Summers is a 62 year old male presenting to clinic for follow up on a pulmonary nodule.   He only mild symptoms of shortness of breath (with exertion). He has no cough, no hemoptysis, and no sputum production. Symptoms are unchanged from prior. He reports smoking 2 packs a day for the past 50 years, and has cut back to 1 pack a day. He has no interest  in quitting smoking. He reports that he was institutionalized for psychiatric illness for 30 years. He is maintained on halopreidol, olanzapine, and valbenazine. He lives alone.   He is here to follow up on his pulmonary nodule. His PCP obtained a chest CT in April of 2023 that showed said pulmonary nodule for which he was referred. I had ordered a repeat CT (showing no change in the nodule) and a PET/CT showing no FDG uptake in the nodule. A Dotatate scan was also negative and not suggestive of carcinoid. He then underwent robotic assisted navigational bronchoscopy on 06/02/2022 with biopsy of the nodule. The pathology/cytology report is showing atypical cells, but is inconclusive for definitive malignant cells. I had discussed this with him over the phone following the procedure and he is here to discuss this further.  He also has his advanced directives with him that we discussed during our last visit. The directives don't mention DNR/DNI, only that he would not want his life extended if there's not hope of meaningful recovery. The papers will be scanned into the medical record.  Ancillary information including prior medications, full medical/surgical/family/social histories, and PFTs (when available) are listed below and have been reviewed.   Review of Systems  Constitutional:  Negative for chills, fever and weight loss.  Respiratory:  Negative for cough, hemoptysis, sputum production and shortness of breath.   Cardiovascular:  Negative for chest pain and palpitations.     Objective:   Vitals:   06/29/22 1118  BP: 120/74  Pulse: 71  Temp: 98.2 F (36.8 C)  TempSrc: Temporal  SpO2: 98%  Weight: 215 lb 3.2 oz (97.6 kg)  Height: '5\' 10"'$  (1.778 m)   98% on RA  BMI Readings from Last 3 Encounters:  06/29/22 30.88 kg/m  06/02/22 30.85 kg/m  04/27/22 29.96 kg/m   Wt Readings from Last 3 Encounters:  06/29/22 215 lb 3.2 oz (97.6 kg)  06/02/22 215 lb (97.5 kg)  04/27/22 208 lb 12.8  oz (94.7 kg)    Physical Exam Constitutional:      Appearance: He is obese.  HENT:     Head: Normocephalic and atraumatic.     Nose: Nose normal.     Mouth/Throat:     Mouth: Mucous membranes are moist.  Cardiovascular:     Rate and Rhythm: Normal rate and regular rhythm.     Pulses: Normal pulses.     Heart sounds: Normal heart sounds.  Pulmonary:     Effort: Pulmonary effort is normal.     Breath sounds: Normal breath sounds.  Musculoskeletal:     Cervical back: Neck supple.  Lymphadenopathy:     Cervical: No cervical adenopathy.  Neurological:     General: No focal deficit present.     Mental Status: He is alert and oriented to person, place, and time. Mental status is at baseline.       Ancillary Information    Past Medical  History:  Diagnosis Date   COPD (chronic obstructive pulmonary disease) ([facility])    Diabetes mellitus without complication ([facility])    Type II   GERD (gastroesophageal reflux disease)    Hepatitis    C   Hyperlipidemia    Schizophrenia ([place])      Family History  Problem Relation Age of Onset   Lung cancer Mother    Pneumonia Maternal Grandmother      Past Surgical History:  Procedure Laterality Date   BRONCHIAL BIOPSY  06/02/2022   Procedure: BRONCHIAL BIOPSIES;  Surgeon: Armando Reichert, MD;  Location: [facility] ENDOSCOPY;  Service: Pulmonary;;   BRONCHIAL BRUSHINGS  06/02/2022   Procedure: BRONCHIAL BRUSHINGS;  Surgeon: Armando Reichert, MD;  Location: [place];  Service: Pulmonary;;   BRONCHIAL NEEDLE ASPIRATION BIOPSY  06/02/2022   Procedure: BRONCHIAL NEEDLE ASPIRATION BIOPSIES;  Surgeon: Armando Reichert, MD;  Location: [place];  Service: Pulmonary;;   COLONOSCOPY     COLONOSCOPY WITH PROPOFOL N/A 09/25/2015   Procedure: COLONOSCOPY WITH PROPOFOL;  Surgeon: [name], MD;  Location: [facility] ENDOSCOPY;  Service: Endoscopy;  Laterality: N/A;   COLONOSCOPY WITH PROPOFOL N/A 09/29/2015   Procedure: COLONOSCOPY WITH PROPOFOL;  Surgeon:  [name], MD;  Location: [facility] ENDOSCOPY;  Service: Endoscopy;  Laterality: N/A;   FOREARM SURGERY     TOE SURGERY     VIDEO BRONCHOSCOPY WITH RADIAL ENDOBRONCHIAL ULTRASOUND  06/02/2022   Procedure: VIDEO BRONCHOSCOPY WITH RADIAL ENDOBRONCHIAL ULTRASOUND;  Surgeon: Armando Reichert, MD;  Location: [facility] ENDOSCOPY;  Service: Pulmonary;;   WRIST SURGERY Left     Social History   Socioeconomic History   Marital status: Single    Spouse name: Not on file   Number of children: Not on file   Years of education: Not on file   Highest education level: Not on file  Occupational History   Not on file  Tobacco Use   Smoking status: Every Day    Packs/day: 2.00    Years: 50.00    Total pack years: 100.00    Types: Cigarettes   Smokeless tobacco: Never   Tobacco comments:    1PPD 04/27/2022 khj  Vaping Use   Vaping Use: Never used  Substance and Sexual Activity   Alcohol use: Yes    Alcohol/week: 2.0 standard drinks of alcohol    Types: 2 Cans of beer per week    Comment: 1-2 cans once weekly   Drug use: No   Sexual activity: Not on file  Other Topics Concern   Not on file  Social History Narrative   Not on file   Social Determinants of Health   Financial Resource Strain: Not on file  Food Insecurity: Not on file  Transportation Needs: Not on file  Physical Activity: Not on file  Stress: Not on file  Social Connections: Not on file  Intimate Partner Violence: Not on file     Allergies  Allergen Reactions   Ether Anaphylaxis   Sulfa Antibiotics Swelling and Rash     CBC    Component Value Date/Time   WBC 6.6 06/02/2022 0624   RBC 5.06 06/02/2022 0624   HGB 14.6 06/02/2022 0624   HCT 43.9 06/02/2022 0624   PLT 282 06/02/2022 0624   MCV 86.8 06/02/2022 0624   MCH 28.9 06/02/2022 0624   MCHC 33.3 06/02/2022 0624   RDW 13.3 06/02/2022 0624    Pulmonary Functions Testing Results:     No data to display  Outpatient Medications Prior to Visit   Medication Sig Dispense Refill   aspirin EC 81 MG tablet Take 81 mg by mouth daily. Swallow whole.     atorvastatin (LIPITOR) 40 MG tablet Take 40 mg by mouth at bedtime.     b complex vitamins capsule Take 1 capsule by mouth daily.     cetirizine (ZYRTEC) 10 MG tablet Take 10 mg by mouth daily.     Cholecalciferol (VITAMIN D) 50 MCG (2000 UT) tablet Take 4,000 Units by mouth daily.     docusate sodium (STOOL SOFTENER) 100 MG capsule Take 100 mg by mouth every morning.     Fish Oil-Cholecalciferol (OMEGA-3 FISH OIL/VITAMIN D3) 1000-1000 MG-UNIT CAPS Take by mouth.     haloperidol decanoate (HALDOL DECANOATE) 100 MG/ML injection Inject 100 mg into the muscle every 28 (twenty-eight) days.     INGREZZA 80 MG capsule Take 80 mg by mouth at bedtime.     metFORMIN (GLUCOPHAGE) 500 MG tablet Take 500 mg by mouth 2 (two) times daily with a meal.     Multiple Vitamins-Minerals (SENTRY ADULT PO) Take 1 tablet by mouth daily. Centrum Senior     OLANZapine (ZYPREXA) 20 MG tablet Take 20 mg by mouth at bedtime.     Potassium Gluconate 595 MG CAPS Take 595 mg by mouth daily.     traZODone (DESYREL) 50 MG tablet Take 50 mg by mouth at bedtime as needed for sleep.     No facility-administered medications prior to visit.

## 2022-11-14 LAB — COLOGUARD: COLOGUARD: NEGATIVE

## 2023-03-16 ENCOUNTER — Other Ambulatory Visit: Payer: Self-pay

## 2023-03-16 DIAGNOSIS — J449 Chronic obstructive pulmonary disease, unspecified: Secondary | ICD-10-CM
